# Patient Record
Sex: Female | Born: 1980 | Race: Black or African American | Hispanic: No | Marital: Single | Smoking: Former smoker
Health system: Southern US, Community
[De-identification: ages and names within clinical notes are randomized; demographics above are authoritative.]

## PROBLEM LIST (undated history)

## (undated) DIAGNOSIS — D510 Vitamin B12 deficiency anemia due to intrinsic factor deficiency: Secondary | ICD-10-CM

## (undated) DIAGNOSIS — N390 Urinary tract infection, site not specified: Secondary | ICD-10-CM

## (undated) DIAGNOSIS — D519 Vitamin B12 deficiency anemia, unspecified: Secondary | ICD-10-CM

## (undated) HISTORY — PX: HEMORROIDECTOMY: SUR656

## (undated) HISTORY — DX: Vitamin B12 deficiency anemia, unspecified: D51.9

## (undated) HISTORY — DX: Vitamin B12 deficiency anemia due to intrinsic factor deficiency: D51.0

---

## 2009-07-23 ENCOUNTER — Emergency Department (HOSPITAL_BASED_OUTPATIENT_CLINIC_OR_DEPARTMENT_OTHER): Admission: EM | Admit: 2009-07-23 | Discharge: 2009-07-23 | Payer: Self-pay | Admitting: Emergency Medicine

## 2009-09-14 ENCOUNTER — Emergency Department (HOSPITAL_BASED_OUTPATIENT_CLINIC_OR_DEPARTMENT_OTHER): Admission: EM | Admit: 2009-09-14 | Discharge: 2009-09-14 | Payer: Self-pay | Admitting: Emergency Medicine

## 2009-12-14 ENCOUNTER — Emergency Department (HOSPITAL_BASED_OUTPATIENT_CLINIC_OR_DEPARTMENT_OTHER): Admission: EM | Admit: 2009-12-14 | Discharge: 2009-12-14 | Payer: Self-pay | Admitting: Emergency Medicine

## 2010-07-06 ENCOUNTER — Emergency Department (HOSPITAL_COMMUNITY)
Admission: EM | Admit: 2010-07-06 | Discharge: 2010-07-06 | Disposition: A | Discharge status: Home / Self Care | Payer: Medicaid Other | Attending: Emergency Medicine | Admitting: Emergency Medicine

## 2010-07-06 DIAGNOSIS — O99891 Other specified diseases and conditions complicating pregnancy: Secondary | ICD-10-CM | POA: Insufficient documentation

## 2010-07-06 DIAGNOSIS — M546 Pain in thoracic spine: Secondary | ICD-10-CM | POA: Insufficient documentation

## 2010-07-06 DIAGNOSIS — M545 Low back pain, unspecified: Secondary | ICD-10-CM | POA: Insufficient documentation

## 2010-07-06 LAB — URINALYSIS, ROUTINE W REFLEX MICROSCOPIC
Ketones, ur: NEGATIVE mg/dL
Nitrite: NEGATIVE
Specific Gravity, Urine: 1.015 (ref 1.005–1.030)
pH: 7.5 (ref 5.0–8.0)

## 2010-07-06 LAB — WET PREP, GENITAL: Yeast Wet Prep HPF POC: NONE SEEN

## 2011-07-23 ENCOUNTER — Emergency Department (HOSPITAL_BASED_OUTPATIENT_CLINIC_OR_DEPARTMENT_OTHER)
Admission: EM | Admit: 2011-07-23 | Discharge: 2011-07-23 | Disposition: A | Discharge status: Home / Self Care | Payer: Self-pay | Attending: Emergency Medicine | Admitting: Emergency Medicine

## 2011-07-23 ENCOUNTER — Encounter (HOSPITAL_BASED_OUTPATIENT_CLINIC_OR_DEPARTMENT_OTHER): Payer: Self-pay | Admitting: *Deleted

## 2011-07-23 DIAGNOSIS — H9209 Otalgia, unspecified ear: Secondary | ICD-10-CM | POA: Insufficient documentation

## 2011-07-23 DIAGNOSIS — H6091 Unspecified otitis externa, right ear: Secondary | ICD-10-CM

## 2011-07-23 DIAGNOSIS — H60399 Other infective otitis externa, unspecified ear: Secondary | ICD-10-CM | POA: Insufficient documentation

## 2011-07-23 MED ORDER — AMOXICILLIN 500 MG PO CAPS: 500.0000 mg | ORAL_CAPSULE | Freq: Three times a day (TID) | ORAL | Status: AC

## 2011-07-23 MED ORDER — NEOMYCIN-COLIST-HC-THONZONIUM 3.3-3-10-0.5 MG/ML OT SUSP
4.0000 [drp] | Freq: Four times a day (QID) | OTIC | Status: DC
  Administered 2011-07-23 (×2): 4 [drp] via OTIC
  Filled 2011-07-23: qty 5

## 2011-07-23 NOTE — Discharge Instructions (Signed)
Otitis Externa Swimmer's ear (otitis externa) is an infection in the outer ear canal. It can be caused by a germ or a fungus. It may be caused by:  Swimming in dirty water.   Water that stays in the ear after swimming.  HOME CARE  Put drops in the ear canal as told by your doctor.   Only take medicine as told by your doctor.  GET HELP RIGHT AWAY IF:   You have a temperature by mouth above 102 F (38.9 C), not controlled by medicine.   There is ear pain after 3 days.  MAKE SURE YOU:   Understand these instructions.   Will watch this condition.   Will get help right away if you are not doing well or get worse.  Document Released: 10/30/2007 Document Revised: 01/23/2011 Document Reviewed: 10/30/2007 [employer] Patient Information 2012 [employer]. 

## 2011-07-23 NOTE — ED Notes (Signed)
Pt c/o right ear pain x 2 days 

## 2011-07-23 NOTE — ED Provider Notes (Signed)
History     CSN: [id number]  Arrival date & time 07/23/11  1747   First MD Initiated Contact with Patient 07/23/11 1756      Chief Complaint  Patient presents with  . Otalgia    (Consider location/radiation/quality/duration/timing/severity/associated sxs/prior treatment) Patient is a 31 y.o. female presenting with ear pain. The history is provided by the patient. No language interpreter was used.  Otalgia This is a new problem. The current episode started more than 2 days ago. There is pain in the right ear. The problem occurs constantly. The problem has not changed since onset.There has been no fever. The pain is severe. Associated symptoms include sore throat. Pertinent negatives include no rhinorrhea and no vomiting.    History reviewed. No pertinent past medical history.  History reviewed. No pertinent past surgical history.  History reviewed. No pertinent family history.  History  Substance Use Topics  . Smoking status: Current Some Day Smoker  . Smokeless tobacco: Not on file  . Alcohol Use: No    OB History    Grav Para Term Preterm Abortions TAB SAB Ect Mult Living                  Review of Systems  Constitutional: Negative.   HENT: Positive for ear pain and sore throat. Negative for rhinorrhea.   Eyes: Negative.   Respiratory: Negative.   Cardiovascular: Negative.   Gastrointestinal: Negative for vomiting.  Neurological: Negative.     Allergies  Review of patient's allergies indicates no known allergies.  Home Medications   Current Outpatient Rx  Name Route Sig Dispense Refill  . DM-PHENYLEPHRINE-ACETAMINOPHEN 10-5-325 MG PO CAPS Oral Take 2 capsules by mouth once as needed. For sinus congestion    . LEVONORGESTREL 20 MCG/24HR IU IUD Intrauterine 1 each by Intrauterine route once. Inserted in June 2012      BP 108/67  Pulse 66  Temp(Src) 98.5 F (36.9 C) (Oral)  Resp 18  Ht 5\' 5"  (1.651 m)  Wt 180 lb (81.647 kg)  BMI 29.95 kg/m2  SpO2  100%  Physical Exam  Nursing note and vitals reviewed. Constitutional: She is oriented to person, place, and time. She appears well-developed and well-nourished.  HENT:  Head: Normocephalic and atraumatic.  Left Ear: Tympanic membrane normal.  Ears:  Cardiovascular: Normal rate and regular rhythm.   Pulmonary/Chest: Effort normal and breath sounds normal.  Musculoskeletal: Normal range of motion.  Neurological: She is alert and oriented to person, place, and time.  Skin: Skin is warm and dry.    ED Course  Procedures (including critical care time)  Labs Reviewed - No data to display No results found.   1. Otitis externa of right ear       MDM  Ear wick placed in right ear as well as drops        Teressa Lower, NP 07/23/11 1831

## 2011-07-25 NOTE — ED Provider Notes (Signed)
Medical screening examination/treatment/procedure(s) were performed by non-physician practitioner and as supervising physician I was immediately available for consultation/collaboration.  Tiffanyann Deroo, MD 07/25/11 0956 

## 2011-08-19 ENCOUNTER — Emergency Department (HOSPITAL_BASED_OUTPATIENT_CLINIC_OR_DEPARTMENT_OTHER)
Admission: EM | Admit: 2011-08-19 | Discharge: 2011-08-19 | Disposition: A | Discharge status: Home / Self Care | Payer: Self-pay | Attending: Emergency Medicine | Admitting: Emergency Medicine

## 2011-08-19 ENCOUNTER — Encounter (HOSPITAL_BASED_OUTPATIENT_CLINIC_OR_DEPARTMENT_OTHER): Payer: Self-pay | Admitting: *Deleted

## 2011-08-19 DIAGNOSIS — B029 Zoster without complications: Secondary | ICD-10-CM | POA: Insufficient documentation

## 2011-08-19 DIAGNOSIS — F17200 Nicotine dependence, unspecified, uncomplicated: Secondary | ICD-10-CM | POA: Insufficient documentation

## 2011-08-19 MED ORDER — VALACYCLOVIR HCL 1 G PO TABS: 1000.0000 mg | ORAL_TABLET | Freq: Three times a day (TID) | ORAL | Status: AC

## 2011-08-19 MED ORDER — OXYCODONE-ACETAMINOPHEN 7.5-325 MG PO TABS: 1.0000 | ORAL_TABLET | ORAL | Status: AC | PRN

## 2011-08-19 NOTE — Discharge Instructions (Signed)

## 2011-08-19 NOTE — ED Provider Notes (Signed)
History  This chart was scribed for Denise Shi, MD by Bennett Scrape. This patient was seen in room MH12/MH12 and the patient's care was started at 5:49PM.  CSN: [id number]  Arrival date & time 08/19/11  1721   First MD Initiated Contact with Patient 08/19/11 1744      Chief Complaint  Patient presents with  . Rash    The history is provided by the patient. No language interpreter was used.    Denise Nunez is a 31 y.o. female who presents to the Emergency Department complaining of 3 to 4 days of gradual onset, gradually worsening, constant rash-like areas on the right flank and lower back. Pt states that the first rash-like area appeared on the right flank and then spread to the lower back. The areas are tender to the touch and that the pain is worse with touch. She reports that there was minimal reddish discharge last night. She denies taking any medications PTA to improve the symptoms. She denies fever, ear pain, congestion, cough, nausea, emesis, HA and urinary symptoms as associated symptoms. She has no h/o chronic medical conditions. She is a current everyday smoker but denies alcohol use.  History reviewed. No pertinent past medical history.  History reviewed. No pertinent past surgical history.  No family history on file.  History  Substance Use Topics  . Smoking status: Current Some Day Smoker  . Smokeless tobacco: Not on file  . Alcohol Use: No    Review of Systems  A complete 10 system review of systems was obtained and is otherwise negative except as noted in the HPI.   Allergies  Review of patient's allergies indicates no known allergies.  Home Medications   Current Outpatient Rx  Name Route Sig Dispense Refill  . DM-PHENYLEPHRINE-ACETAMINOPHEN 10-5-325 MG PO CAPS Oral Take 2 capsules by mouth once as needed. For sinus congestion    . LEVONORGESTREL 20 MCG/24HR IU IUD Intrauterine 1 each by Intrauterine route once. Inserted in June 2012    .  OXYCODONE-ACETAMINOPHEN 7.5-325 MG PO TABS Oral Take 1 tablet by mouth every 4 (four) hours as needed for pain. 30 tablet 0  . VALACYCLOVIR HCL 1 G PO TABS Oral Take 1 tablet (1,000 mg total) by mouth 3 (three) times daily. 21 tablet 0    Triage Vitals: BP 109/69  Pulse 58  Temp(Src) 98.3 F (36.8 C) (Oral)  Resp 20  SpO2 100%  Physical Exam  Nursing note and vitals reviewed. Constitutional: She is oriented to person, place, and time. She appears well-developed and well-nourished. No distress.  HENT:  Head: Normocephalic and atraumatic.  Eyes: Conjunctivae and EOM are normal. Pupils are equal, round, and reactive to light.  Neck: Normal range of motion. Neck supple.  Cardiovascular: Normal rate, regular rhythm and intact distal pulses.   Pulmonary/Chest: Effort normal and breath sounds normal. No respiratory distress.  Abdominal: Soft. Normal appearance. She exhibits no distension.  Musculoskeletal: Normal range of motion. She exhibits no edema.  Neurological: She is alert and oriented to person, place, and time. No cranial nerve deficit.  Skin: Skin is warm and dry. Rash noted.       vesicular lesions along the right flank and back area consistant with zoster    Psychiatric: She has a normal mood and affect. Her behavior is normal.    ED Course  Procedures (including critical care time)   DIAGNOSTIC STUDIES: Oxygen Saturation is 100% on room air, normal by my interpretation.    COORDINATION  OF CARE: 5:52PM-Discussed shingles diagnosis and discharge treatment plan. Will prescribe pain medications.    Labs Reviewed - No data to display No results found.   1. Shingles       MDM    I personally performed the services described in this documentation, which was scribed in my presence. The recorded information has been reviewed and considered.        Denise Shi, MD 08/20/11 1259

## 2011-08-19 NOTE — ED Notes (Signed)
Skin hurts on her right side of her abdomen and back. Pustules x 2 noted. One raised area noted.

## 2011-09-26 ENCOUNTER — Encounter (HOSPITAL_BASED_OUTPATIENT_CLINIC_OR_DEPARTMENT_OTHER): Payer: Self-pay | Admitting: Emergency Medicine

## 2011-09-26 ENCOUNTER — Emergency Department (HOSPITAL_BASED_OUTPATIENT_CLINIC_OR_DEPARTMENT_OTHER)
Admission: EM | Admit: 2011-09-26 | Discharge: 2011-09-26 | Disposition: A | Discharge status: Home / Self Care | Payer: Self-pay | Attending: Emergency Medicine | Admitting: Emergency Medicine

## 2011-09-26 DIAGNOSIS — R3589 Other polyuria: Secondary | ICD-10-CM | POA: Insufficient documentation

## 2011-09-26 DIAGNOSIS — R300 Dysuria: Secondary | ICD-10-CM | POA: Insufficient documentation

## 2011-09-26 DIAGNOSIS — Z79899 Other long term (current) drug therapy: Secondary | ICD-10-CM | POA: Insufficient documentation

## 2011-09-26 DIAGNOSIS — R358 Other polyuria: Secondary | ICD-10-CM | POA: Insufficient documentation

## 2011-09-26 DIAGNOSIS — R109 Unspecified abdominal pain: Secondary | ICD-10-CM | POA: Insufficient documentation

## 2011-09-26 DIAGNOSIS — R10819 Abdominal tenderness, unspecified site: Secondary | ICD-10-CM | POA: Insufficient documentation

## 2011-09-26 DIAGNOSIS — F17200 Nicotine dependence, unspecified, uncomplicated: Secondary | ICD-10-CM | POA: Insufficient documentation

## 2011-09-26 DIAGNOSIS — N3090 Cystitis, unspecified without hematuria: Secondary | ICD-10-CM | POA: Insufficient documentation

## 2011-09-26 LAB — URINALYSIS, ROUTINE W REFLEX MICROSCOPIC
Glucose, UA: NEGATIVE mg/dL
Ketones, ur: >80 mg/dL — AB
Protein, ur: 30 mg/dL — AB

## 2011-09-26 LAB — URINE MICROSCOPIC-ADD ON

## 2011-09-26 MED ORDER — SULFAMETHOXAZOLE-TMP DS 800-160 MG PO TABS
1.0000 | ORAL_TABLET | Freq: Once | ORAL | Status: AC
  Administered 2011-09-26: 1 via ORAL
  Filled 2011-09-26: qty 1

## 2011-09-26 MED ORDER — PHENAZOPYRIDINE HCL 200 MG PO TABS: 200.0000 mg | ORAL_TABLET | Freq: Three times a day (TID) | ORAL | Status: AC

## 2011-09-26 MED ORDER — SULFAMETHOXAZOLE-TMP DS 800-160 MG PO TABS: 1.0000 | ORAL_TABLET | Freq: Two times a day (BID) | ORAL | Status: AC

## 2011-09-26 NOTE — ED Notes (Signed)
Pt reporting 8/10 pain on discharge. No nonverbal signs of pain.

## 2011-09-26 NOTE — ED Provider Notes (Signed)
History     CSN: [id number]  Arrival date & time 09/26/11  1641   First MD Initiated Contact with Patient 09/26/11 1649      Chief Complaint  Patient presents with  . Dysuria    (Consider location/radiation/quality/duration/timing/severity/associated sxs/prior treatment) Patient is a 31 y.o. female presenting with dysuria.  Dysuria    The patient presents with dysuria.  She notes her symptoms began gradually about 2 days ago.  Since onset symptoms have been persistent, would now with polyuria and suprapubic pressure.  No clear precipitating, exacerbating, or alleviating factors.  She notes that she has no other pain, fevers, chills, nausea, vomiting, diarrhea.  She states that these complaints are similar to those she has had in the past when she has had urinary tract infections. She denies any new vaginal complaints and is adamant that she is not pregnant History reviewed. No pertinent past medical history.  History reviewed. No pertinent past surgical history.  No family history on file.  History  Substance Use Topics  . Smoking status: Current Some Day Smoker  . Smokeless tobacco: Not on file  . Alcohol Use: No    OB History    Grav Para Term Preterm Abortions TAB SAB Ect Mult Living                  Review of Systems  Genitourinary: Positive for dysuria.  All other systems reviewed and are negative.    Allergies  Review of patient's allergies indicates no known allergies.  Home Medications   Current Outpatient Rx  Name Route Sig Dispense Refill  . LEVONORGESTREL 20 MCG/24HR IU IUD Intrauterine 1 each by Intrauterine route once. Inserted in June 2012    . PHENAZOPYRIDINE HCL 200 MG PO TABS Oral Take 1 tablet (200 mg total) by mouth 3 (three) times daily. 9 tablet 0  . SULFAMETHOXAZOLE-TMP DS 800-160 MG PO TABS Oral Take 1 tablet by mouth 2 (two) times daily. 10 tablet 0    BP 121/73  Pulse 58  Temp(Src) 97.8 F (36.6 C) (Oral)  Resp 16  SpO2 100%  LMP  09/09/2011  Physical Exam  Nursing note and vitals reviewed. Constitutional: She is oriented to person, place, and time. She appears well-developed and well-nourished. No distress.  HENT:  Head: Normocephalic and atraumatic.  Eyes: Conjunctivae and EOM are normal.  Cardiovascular: Normal rate and regular rhythm.   Pulmonary/Chest: Effort normal and breath sounds normal. No stridor. No respiratory distress.  Abdominal: Soft. She exhibits no distension. There is tenderness in the suprapubic area.  Musculoskeletal: She exhibits no edema.  Neurological: She is alert and oriented to person, place, and time. No cranial nerve deficit.  Skin: Skin is warm and dry.  Psychiatric: She has a normal mood and affect.    ED Course  Procedures (including critical care time)  Labs Reviewed  URINALYSIS, ROUTINE W REFLEX MICROSCOPIC - Abnormal; Notable for the following:    APPearance CLOUDY (*)    Hgb urine dipstick MODERATE (*)    Ketones, ur >80 (*)    Protein, ur 30 (*)    Leukocytes, UA LARGE (*)    All other components within normal limits  URINE MICROSCOPIC-ADD ON - Abnormal; Notable for the following:    Squamous Epithelial / LPF FEW (*)    Bacteria, UA FEW (*)    All other components within normal limits   No results found.   1. Cystitis       MDM  This generally  well appearing female presents with several days of dysuria, polyuria, new suprapubic discomfort.  On exam she is in no distress with mild suprapubic tenderness to palpation, but no inguinal pain.  Patient's vital signs are stable.  The patient's urinalysis demonstrates abnormalities consistent with cystitis.  The patient was discharged with analgesics and antibiotics and instructed to follow up with her primary care physician.       Gerhard Munch, MD 09/26/11 [phone]

## 2011-09-26 NOTE — ED Notes (Signed)
C/o dysuria for 2 days.

## 2011-10-08 ENCOUNTER — Encounter (HOSPITAL_BASED_OUTPATIENT_CLINIC_OR_DEPARTMENT_OTHER): Payer: Self-pay | Admitting: *Deleted

## 2011-10-08 ENCOUNTER — Emergency Department (HOSPITAL_BASED_OUTPATIENT_CLINIC_OR_DEPARTMENT_OTHER)
Admission: EM | Admit: 2011-10-08 | Discharge: 2011-10-08 | Disposition: A | Discharge status: Home / Self Care | Payer: Self-pay | Attending: Emergency Medicine | Admitting: Emergency Medicine

## 2011-10-08 ENCOUNTER — Emergency Department (INDEPENDENT_AMBULATORY_CARE_PROVIDER_SITE_OTHER): Payer: Self-pay

## 2011-10-08 DIAGNOSIS — R0602 Shortness of breath: Secondary | ICD-10-CM

## 2011-10-08 DIAGNOSIS — M940 Chondrocostal junction syndrome [Tietze]: Secondary | ICD-10-CM | POA: Insufficient documentation

## 2011-10-08 DIAGNOSIS — G43909 Migraine, unspecified, not intractable, without status migrainosus: Secondary | ICD-10-CM | POA: Insufficient documentation

## 2011-10-08 DIAGNOSIS — R0789 Other chest pain: Secondary | ICD-10-CM

## 2011-10-08 DIAGNOSIS — R079 Chest pain, unspecified: Secondary | ICD-10-CM | POA: Insufficient documentation

## 2011-10-08 MED ORDER — SODIUM CHLORIDE 0.9 % IV BOLUS (SEPSIS)
1000.0000 mL | Freq: Once | INTRAVENOUS | Status: AC
  Administered 2011-10-08: 1000 mL via INTRAVENOUS

## 2011-10-08 MED ORDER — KETOROLAC TROMETHAMINE 30 MG/ML IJ SOLN
30.0000 mg | Freq: Once | INTRAMUSCULAR | Status: AC
  Administered 2011-10-08: 30 mg via INTRAVENOUS
  Filled 2011-10-08: qty 1

## 2011-10-08 MED ORDER — METOCLOPRAMIDE HCL 5 MG/ML IJ SOLN
10.0000 mg | Freq: Once | INTRAMUSCULAR | Status: AC
  Administered 2011-10-08: 10 mg via INTRAVENOUS
  Filled 2011-10-08: qty 2

## 2011-10-08 MED ORDER — DIPHENHYDRAMINE HCL 50 MG/ML IJ SOLN
25.0000 mg | Freq: Once | INTRAMUSCULAR | Status: AC
  Administered 2011-10-08: 25 mg via INTRAVENOUS
  Filled 2011-10-08: qty 1

## 2011-10-08 NOTE — Discharge Instructions (Signed)
Costochondritis Costochondritis (Tietze syndrome), or costochondral separation, is a swelling and irritation (inflammation) of the tissue (cartilage) that connects your ribs with your breastbone (sternum). It may occur on its own (spontaneously), through damage caused by an accident (trauma), or simply from coughing or minor exercise. It may take up to 6 weeks to get better and longer if you are unable to be conservative in your activities. HOME CARE INSTRUCTIONS   Avoid exhausting physical activity. Try not to strain your ribs during normal activity. This would include any activities using chest, belly (abdominal), and side muscles, especially if heavy weights are used.   Use ice for 15 to 20 minutes per hour while awake for the first 2 days. Place the ice in a plastic bag, and place a towel between the bag of ice and your skin.   Only take over-the-counter or prescription medicines for pain, discomfort, or fever as directed by your caregiver.  SEEK IMMEDIATE MEDICAL CARE IF:   Your pain increases or you are very uncomfortable.   You have a fever.   You develop difficulty with your breathing.   You cough up blood.   You develop worse chest pains, shortness of breath, sweating, or vomiting.   You develop new, unexplained problems (symptoms).  MAKE SURE YOU:   Understand these instructions.   Will watch your condition.   Will get help right away if you are not doing well or get worse.  Document Released: 02/20/2005 Document Revised: 05/02/2011 Document Reviewed: 12/30/2007 [facility] Patient Information 2012 [place], [place].  Take ibuprofen for pain

## 2011-10-08 NOTE — ED Provider Notes (Signed)
History     CSN: [id number]  Arrival date & time 10/08/11  2004   First MD Initiated Contact with Patient 10/08/11 2027      Chief Complaint  Patient presents with  . Chest Pain    (Consider location/radiation/quality/duration/timing/severity/associated sxs/prior treatment) HPI Comments: Patient also has headache consistent with prior migraines. She started smoking again today.  Patient is a 31 y.o. female presenting with chest pain. The history is provided by the patient. No language interpreter was used.  Chest Pain The chest pain began 1 - 2 hours ago. Chest pain occurs constantly. The chest pain is unchanged. The pain is associated with breathing. The severity of the pain is mild. The quality of the pain is described as pressure-like. The pain does not radiate. Chest pain is worsened by deep breathing. Primary symptoms include shortness of breath. Pertinent negatives for primary symptoms include no fever, no fatigue, no syncope, no cough, no palpitations, no abdominal pain, no nausea, no vomiting and no dizziness.  Pertinent negatives for associated symptoms include no numbness and no weakness.     History reviewed. No pertinent past medical history.  History reviewed. No pertinent past surgical history.  No family history on file.  History  Substance Use Topics  . Smoking status: Current Some Day Smoker  . Smokeless tobacco: Not on file  . Alcohol Use: No    OB History    Grav Para Term Preterm Abortions TAB SAB Ect Mult Living                  Review of Systems  Constitutional: Negative for fever, chills, activity change, appetite change and fatigue.  HENT: Negative for congestion, sore throat, rhinorrhea, neck pain and neck stiffness.   Respiratory: Positive for shortness of breath. Negative for cough.   Cardiovascular: Positive for chest pain. Negative for palpitations and syncope.  Gastrointestinal: Negative for nausea, vomiting, abdominal pain and diarrhea.    Genitourinary: Negative for dysuria, urgency, frequency and flank pain.  Musculoskeletal: Negative for myalgias, back pain and arthralgias.  Neurological: Positive for headaches. Negative for dizziness, weakness, light-headedness and numbness.  All other systems reviewed and are negative.    Allergies  Review of patient's allergies indicates no known allergies.  Home Medications   Current Outpatient Rx  Name Route Sig Dispense Refill  . LEVONORGESTREL 20 MCG/24HR IU IUD Intrauterine 1 each by Intrauterine route once. Inserted in June 2012      BP 121/74  Pulse 67  Temp(Src) 98.4 F (36.9 C) (Oral)  Resp 18  SpO2 100%  LMP 10/08/2011  Physical Exam  Nursing note and vitals reviewed. Constitutional: She is oriented to person, place, and time. She appears well-developed and well-nourished. No distress.  HENT:  Head: Normocephalic and atraumatic.  Mouth/Throat: Oropharynx is clear and moist.  Eyes: Conjunctivae and EOM are normal. Pupils are equal, round, and reactive to light.  Neck: Normal range of motion. Neck supple.  Cardiovascular: Normal rate, regular rhythm, normal heart sounds and intact distal pulses.  Exam reveals no gallop and no friction rub.   No murmur heard. Pulmonary/Chest: Effort normal and breath sounds normal. No respiratory distress. She exhibits no tenderness.  Abdominal: Soft. Bowel sounds are normal. There is no tenderness.  Musculoskeletal: Normal range of motion. She exhibits no edema and no tenderness.  Neurological: She is alert and oriented to person, place, and time. She has normal strength. No cranial nerve deficit or sensory deficit.  Skin: Skin is warm and dry.  No rash noted.    ED Course  Procedures (including critical care time)   Date: 10/08/2011  Rate: 71  Rhythm: normal sinus rhythm  QRS Axis: normal  Intervals: normal  ST/T Wave abnormalities: normal  Conduction Disutrbances:none  Narrative Interpretation:   Old EKG Reviewed:  none available  Labs Reviewed - No data to display Dg Chest 2 View  10/08/2011  *RADIOLOGY REPORT*  Clinical Data: Chest tightness, shortness of breath, and migraine  CHEST - 2 VIEW  Comparison: None.  Findings: Shallow inspiration.  Heart size and pulmonary vascularity are normal.  No focal airspace consolidation in the lungs.  No blunting of costophrenic angles.  No pneumothorax.  IMPRESSION: No evidence of active pulmonary disease.  Original Report Authenticated By: Marlon Pel, M.D.     1. Costochondritis   2. Migraine       MDM  PERC negative low clinical Gestalt for pulmonary embolus. Chest x-ray unremarkable. Symptoms resolved after receiving a headache cocktail. She received IV fluids, Toradol, Reglan, Benadryl. This also improved her chest pain. No concern about ACS or pulmonary in with the cause of her pain. She'll be discharged home with instructions to followup with her primary care physician. Ibuprofen as needed for pain. Headache is consistent with prior migraine headaches. There is no indication for imaging. I'm not concerned about a malignant cause of headache such as subarachnoid hemorrhage or meningitis.        Dayton Bailiff, MD 10/08/11 2241

## 2011-10-08 NOTE — ED Notes (Signed)
States 45 mins ago she was smoking a black and mild when she developed chest pain, a headache, and nausea. Symptoms continue on arrival to triage.

## 2011-12-14 ENCOUNTER — Emergency Department (HOSPITAL_BASED_OUTPATIENT_CLINIC_OR_DEPARTMENT_OTHER)
Admission: EM | Admit: 2011-12-14 | Discharge: 2011-12-14 | Disposition: A | Discharge status: Home / Self Care | Payer: Medicaid Other | Attending: Emergency Medicine | Admitting: Emergency Medicine

## 2011-12-14 ENCOUNTER — Encounter (HOSPITAL_BASED_OUTPATIENT_CLINIC_OR_DEPARTMENT_OTHER): Payer: Self-pay | Admitting: *Deleted

## 2011-12-14 DIAGNOSIS — N390 Urinary tract infection, site not specified: Secondary | ICD-10-CM

## 2011-12-14 DIAGNOSIS — F17200 Nicotine dependence, unspecified, uncomplicated: Secondary | ICD-10-CM | POA: Insufficient documentation

## 2011-12-14 HISTORY — DX: Urinary tract infection, site not specified: N39.0

## 2011-12-14 LAB — URINALYSIS, ROUTINE W REFLEX MICROSCOPIC
Bilirubin Urine: NEGATIVE
Nitrite: POSITIVE — AB
Specific Gravity, Urine: 1.025 (ref 1.005–1.030)
pH: 6 (ref 5.0–8.0)

## 2011-12-14 LAB — URINE MICROSCOPIC-ADD ON

## 2011-12-14 LAB — PREGNANCY, URINE: Preg Test, Ur: NEGATIVE

## 2011-12-14 MED ORDER — PHENAZOPYRIDINE HCL 200 MG PO TABS: 200.0000 mg | ORAL_TABLET | Freq: Three times a day (TID) | ORAL | Status: AC

## 2011-12-14 MED ORDER — PHENAZOPYRIDINE HCL 100 MG PO TABS
95.0000 mg | ORAL_TABLET | Freq: Once | ORAL | Status: AC
  Administered 2011-12-14: 100 mg via ORAL
  Filled 2011-12-14: qty 1

## 2011-12-14 MED ORDER — SULFAMETHOXAZOLE-TRIMETHOPRIM 800-160 MG PO TABS: 1.0000 | ORAL_TABLET | Freq: Two times a day (BID) | ORAL | Status: AC

## 2011-12-14 MED ORDER — SULFAMETHOXAZOLE-TMP DS 800-160 MG PO TABS
1.0000 | ORAL_TABLET | Freq: Once | ORAL | Status: AC
  Administered 2011-12-14: 1 via ORAL
  Filled 2011-12-14: qty 1

## 2011-12-14 NOTE — ED Provider Notes (Signed)
Medical screening examination/treatment/procedure(s) were performed by non-physician practitioner and as supervising physician I was immediately available for consultation/collaboration.   Rolan Bucco, MD 12/14/11 [phone]

## 2011-12-14 NOTE — ED Provider Notes (Signed)
History     CSN: [id number]  Arrival date & time 12/14/11  2113   First MD Initiated Contact with Patient 12/14/11 2248      Chief Complaint  Patient presents with  . Urinary Frequency    (Consider location/radiation/quality/duration/timing/severity/associated sxs/prior treatment) HPI Comments: Pt states that she is having urinary frequency and dysuria:pt denies fever:pt states that she had similar symptoms a couple of months ago:pt states that she is not having any vaginal discharge  Patient is a 31 y.o. female presenting with frequency. The history is provided by the patient. No language interpreter was used.  Urinary Frequency This is a new problem. The current episode started in the past 7 days. The problem occurs constantly. The problem has been unchanged. Pertinent negatives include no fever. Nothing aggravates the symptoms. She has tried nothing for the symptoms.    Past Medical History  Diagnosis Date  . UTI (urinary tract infection)     History reviewed. No pertinent past surgical history.  History reviewed. No pertinent family history.  History  Substance Use Topics  . Smoking status: Current Some Day Smoker  . Smokeless tobacco: Not on file  . Alcohol Use: No    OB History    Grav Para Term Preterm Abortions TAB SAB Ect Mult Living                  Review of Systems  Constitutional: Negative.  Negative for fever.  Respiratory: Negative.   Cardiovascular: Negative.   Genitourinary: Positive for frequency.    Allergies  Review of patient's allergies indicates no known allergies.  Home Medications   Current Outpatient Rx  Name Route Sig Dispense Refill  . LEVONORGESTREL 20 MCG/24HR IU IUD Intrauterine 1 each by Intrauterine route once. Inserted in June 2012    . PHENAZOPYRIDINE HCL 200 MG PO TABS Oral Take 1 tablet (200 mg total) by mouth 3 (three) times daily. 6 tablet 0  . SULFAMETHOXAZOLE-TRIMETHOPRIM 800-160 MG PO TABS Oral Take 1 tablet by  mouth every 12 (twelve) hours. 10 tablet 0    BP 120/70  Pulse 82  Temp 97.8 F (36.6 C) (Oral)  Resp 20  Ht 5\' 5"  (1.651 m)  Wt 165 lb (74.844 kg)  BMI 27.46 kg/m2  SpO2 100%  LMP 11/25/2011  Physical Exam  Nursing note and vitals reviewed. Constitutional: She is oriented to person, place, and time. She appears well-developed and well-nourished.  HENT:  Head: Normocephalic and atraumatic.  Cardiovascular: Normal rate and regular rhythm.   Pulmonary/Chest: Effort normal and breath sounds normal.  Abdominal: Soft. Bowel sounds are normal. There is no tenderness. There is no CVA tenderness.  Musculoskeletal: Normal range of motion.  Neurological: She is alert and oriented to person, place, and time.  Skin: Skin is warm and dry.  Psychiatric: She has a normal mood and affect.    ED Course  Procedures (including critical care time)  Labs Reviewed  URINALYSIS, ROUTINE W REFLEX MICROSCOPIC - Abnormal; Notable for the following:    APPearance TURBID (*)     Hgb urine dipstick LARGE (*)     Protein, ur 100 (*)     Nitrite POSITIVE (*)     Leukocytes, UA LARGE (*)     All other components within normal limits  URINE MICROSCOPIC-ADD ON - Abnormal; Notable for the following:    Squamous Epithelial / LPF FEW (*)     Bacteria, UA MANY (*)     All other components within normal  limits  PREGNANCY, URINE  URINE CULTURE   No results found.   1. UTI (lower urinary tract infection)       MDM  Will treat for simple ZOX:WRUEA sent for culture        Teressa Lower, NP 12/14/11 2322

## 2011-12-14 NOTE — ED Notes (Signed)
Frequency and dysuria x 3 days. Hx UTI's. Pt concerned because they are occurring more frequently.

## 2011-12-17 LAB — URINE CULTURE

## 2011-12-18 NOTE — ED Notes (Signed)
+   Urine Patient treated with cipro-sensitive to same-chart appended per protocol MD. 

## 2012-03-31 ENCOUNTER — Encounter (HOSPITAL_BASED_OUTPATIENT_CLINIC_OR_DEPARTMENT_OTHER): Payer: Self-pay

## 2012-03-31 ENCOUNTER — Emergency Department (HOSPITAL_BASED_OUTPATIENT_CLINIC_OR_DEPARTMENT_OTHER)
Admission: EM | Admit: 2012-03-31 | Discharge: 2012-04-01 | Disposition: A | Discharge status: Home / Self Care | Payer: Medicaid Other | Attending: Emergency Medicine | Admitting: Emergency Medicine

## 2012-03-31 DIAGNOSIS — Z87891 Personal history of nicotine dependence: Secondary | ICD-10-CM | POA: Insufficient documentation

## 2012-03-31 DIAGNOSIS — K319 Disease of stomach and duodenum, unspecified: Secondary | ICD-10-CM | POA: Insufficient documentation

## 2012-03-31 DIAGNOSIS — D649 Anemia, unspecified: Secondary | ICD-10-CM | POA: Insufficient documentation

## 2012-03-31 DIAGNOSIS — R110 Nausea: Secondary | ICD-10-CM | POA: Insufficient documentation

## 2012-03-31 DIAGNOSIS — K279 Peptic ulcer, site unspecified, unspecified as acute or chronic, without hemorrhage or perforation: Secondary | ICD-10-CM

## 2012-03-31 DIAGNOSIS — R630 Anorexia: Secondary | ICD-10-CM | POA: Insufficient documentation

## 2012-03-31 DIAGNOSIS — N390 Urinary tract infection, site not specified: Secondary | ICD-10-CM

## 2012-03-31 DIAGNOSIS — Z79899 Other long term (current) drug therapy: Secondary | ICD-10-CM | POA: Insufficient documentation

## 2012-03-31 LAB — CBC WITH DIFFERENTIAL/PLATELET
Basophils Absolute: 0 10*3/uL (ref 0.0–0.1)
Basophils Relative: 0 % (ref 0–1)
Eosinophils Absolute: 0.3 10*3/uL (ref 0.0–0.7)
Eosinophils Relative: 6 % — ABNORMAL HIGH (ref 0–5)
HCT: 26.2 % — ABNORMAL LOW (ref 36.0–46.0)
Hemoglobin: 8.8 g/dL — ABNORMAL LOW (ref 12.0–15.0)
MCH: 27.8 pg (ref 26.0–34.0)
MCHC: 33.6 g/dL (ref 30.0–36.0)
Monocytes Absolute: 0.3 10*3/uL (ref 0.1–1.0)
Monocytes Relative: 6 % (ref 3–12)
RDW: 19.1 % — ABNORMAL HIGH (ref 11.5–15.5)

## 2012-03-31 LAB — URINALYSIS, ROUTINE W REFLEX MICROSCOPIC
Ketones, ur: NEGATIVE mg/dL
Protein, ur: NEGATIVE mg/dL
Urobilinogen, UA: 1 mg/dL (ref 0.0–1.0)

## 2012-03-31 LAB — COMPREHENSIVE METABOLIC PANEL
AST: 19 U/L (ref 0–37)
Albumin: 4.4 g/dL (ref 3.5–5.2)
BUN: 15 mg/dL (ref 6–23)
Calcium: 9.6 mg/dL (ref 8.4–10.5)
Creatinine, Ser: 0.8 mg/dL (ref 0.50–1.10)
Total Bilirubin: 0.4 mg/dL (ref 0.3–1.2)
Total Protein: 7.6 g/dL (ref 6.0–8.3)

## 2012-03-31 LAB — URINE MICROSCOPIC-ADD ON

## 2012-03-31 LAB — OCCULT BLOOD X 1 CARD TO LAB, STOOL: Fecal Occult Bld: NEGATIVE

## 2012-03-31 LAB — PREGNANCY, URINE: Preg Test, Ur: NEGATIVE

## 2012-03-31 LAB — LIPASE, BLOOD: Lipase: 36 U/L (ref 11–59)

## 2012-03-31 MED ORDER — RANITIDINE HCL 150 MG PO TABS: 150.0000 mg | ORAL_TABLET | Freq: Two times a day (BID) | ORAL | Status: DC

## 2012-03-31 MED ORDER — SUCRALFATE 1 GM/10ML PO SUSP: 1.0000 g | Freq: Four times a day (QID) | ORAL | Status: DC

## 2012-03-31 MED ORDER — NITROFURANTOIN MONOHYD MACRO 100 MG PO CAPS
100.0000 mg | ORAL_CAPSULE | Freq: Once | ORAL | Status: AC
Start: 2012-03-31 — End: 2012-03-31
  Administered 2012-03-31: 100 mg via ORAL
  Filled 2012-03-31: qty 1

## 2012-03-31 MED ORDER — ONDANSETRON 4 MG PO TBDP
4.0000 mg | ORAL_TABLET | Freq: Once | ORAL | Status: AC
  Administered 2012-03-31: 4 mg via ORAL
  Filled 2012-03-31: qty 1

## 2012-03-31 MED ORDER — ONDANSETRON HCL 4 MG PO TABS: 4.0000 mg | ORAL_TABLET | Freq: Four times a day (QID) | ORAL | Status: DC

## 2012-03-31 MED ORDER — NITROFURANTOIN MONOHYD MACRO 100 MG PO CAPS: 100.0000 mg | ORAL_CAPSULE | Freq: Two times a day (BID) | ORAL | Status: DC

## 2012-03-31 MED ORDER — FAMOTIDINE 20 MG PO TABS
40.0000 mg | ORAL_TABLET | Freq: Once | ORAL | Status: AC
  Administered 2012-03-31: 40 mg via ORAL
  Filled 2012-03-31: qty 2

## 2012-03-31 MED ORDER — GI COCKTAIL ~~LOC~~
30.0000 mL | Freq: Once | ORAL | Status: AC
  Administered 2012-03-31: 30 mL via ORAL
  Filled 2012-03-31: qty 30

## 2012-03-31 NOTE — ED Notes (Signed)
Pt reports severe upper abdominal pain x 3 weeks, mid epigastric area, pain is worse after eating, pt reports some relief with lying down, but unable to tolerate sitting. Pt reports using mylanta with some relief, but pain always returned, pepto eased pain but no complete relief. Pt reports a lot of stress right now in her life

## 2012-03-31 NOTE — ED Notes (Signed)
Pt reports is unable to void at this time

## 2012-03-31 NOTE — ED Notes (Signed)
Pt recently quite smoking 2 weeks ago and has not had a cigarette since

## 2012-03-31 NOTE — ED Provider Notes (Addendum)
History     CSN: [id number]  Arrival date & time 03/31/12  2118   First MD Initiated Contact with Patient 03/31/12 2157      Chief Complaint  Patient presents with  . Abdominal Pain    (Consider location/radiation/quality/duration/timing/severity/associated sxs/prior treatment) Patient is a 31 y.o. female presenting with abdominal pain. The history is provided by the patient.  Abdominal Pain The primary symptoms of the illness include abdominal pain and nausea. The primary symptoms of the illness do not include fever, vomiting, diarrhea, vaginal discharge or vaginal bleeding. Episode onset: 3 weeks ago. The onset of the illness was gradual. The problem has been gradually worsening.  The pain came on gradually. The abdominal pain is located in the epigastric region. The abdominal pain does not radiate. The severity of the abdominal pain is 8/10. The abdominal pain is relieved by nothing. The abdominal pain is exacerbated by eating.  The illness is associated with eating (No alcohol, NSAID use). The patient states that she believes she is currently not pregnant. Additional symptoms associated with the illness include anorexia. Symptoms associated with the illness do not include constipation, urgency, frequency or back pain. Significant associated medical issues do not include PUD or GERD.    Past Medical History  Diagnosis Date  . UTI (urinary tract infection)     History reviewed. No pertinent past surgical history.  No family history on file.  History  Substance Use Topics  . Smoking status: Former Games developer  . Smokeless tobacco: Not on file  . Alcohol Use: Yes    OB History    Grav Para Term Preterm Abortions TAB SAB Ect Mult Living                  Review of Systems  Constitutional: Negative for fever.  Gastrointestinal: Positive for nausea, abdominal pain and anorexia. Negative for vomiting, diarrhea and constipation.  Genitourinary: Negative for urgency, frequency,  vaginal bleeding and vaginal discharge.  Musculoskeletal: Negative for back pain.  All other systems reviewed and are negative.    Allergies  Review of patient's allergies indicates no known allergies.  Home Medications   Current Outpatient Rx  Name  Route  Sig  Dispense  Refill  . LEVONORGESTREL 20 MCG/24HR IU IUD   Intrauterine   1 each by Intrauterine route once. Inserted in June 2012           BP 123/79  Pulse 80  Temp 98.7 F (37.1 C) (Oral)  Resp 20  Ht 5\' 5"  (1.651 m)  Wt 164 lb 4.8 oz (74.526 kg)  BMI 27.34 kg/m2  SpO2 100%  LMP 03/22/2012  Physical Exam  Nursing note and vitals reviewed. Constitutional: She is oriented to person, place, and time. She appears well-developed and well-nourished. No distress.  HENT:  Head: Normocephalic and atraumatic.  Mouth/Throat: Oropharynx is clear and moist.  Eyes: Conjunctivae normal and EOM are normal. Pupils are equal, round, and reactive to light.  Neck: Normal range of motion. Neck supple.  Cardiovascular: Normal rate, regular rhythm and intact distal pulses.   No murmur heard. Pulmonary/Chest: Effort normal and breath sounds normal. No respiratory distress. She has no wheezes. She has no rales.  Abdominal: Soft. Normal appearance. She exhibits no distension. There is tenderness in the epigastric area. There is guarding. There is no rebound, no tenderness at McBurney's point and negative Murphy's sign.  Musculoskeletal: Normal range of motion. She exhibits no edema and no tenderness.  Neurological: She is alert and oriented  to person, place, and time.  Skin: Skin is warm and dry. No rash noted. No erythema.  Psychiatric: She has a normal mood and affect. Her behavior is normal.    ED Course  Procedures (including critical care time)  Labs Reviewed  URINALYSIS, ROUTINE W REFLEX MICROSCOPIC - Abnormal; Notable for the following:    Nitrite POSITIVE (*)     Leukocytes, UA TRACE (*)     All other components within  normal limits  CBC WITH DIFFERENTIAL - Abnormal; Notable for the following:    RBC 3.17 (*)     Hemoglobin 8.8 (*)     HCT 26.2 (*)     RDW 19.1 (*)     Neutrophils Relative 32 (*)     Neutro Abs 1.6 (*)     Lymphocytes Relative 56 (*)     Eosinophils Relative 6 (*)     All other components within normal limits  URINE MICROSCOPIC-ADD ON - Abnormal; Notable for the following:    Bacteria, UA MANY (*)     All other components within normal limits  PREGNANCY, URINE  COMPREHENSIVE METABOLIC PANEL  LIPASE, BLOOD  OCCULT BLOOD X 1 CARD TO LAB, STOOL  URINE CULTURE   No results found.   1. UTI (lower urinary tract infection)   2. PUD (peptic ulcer disease)       MDM   Patient with abdominal pain is most suggestive of peptic ulcers. She complains of severe stomach pain that is worse after eating. The pain has gradually worsened over last 3 weeks. She complains of nausea after eating but denies emesis. She states she's been under an extreme amount of stress but denies NSAIDs, alcohol or tobacco use. She currently is taking no medications. On exam she has significant epigastric tenderness but no symptoms concerning for perforated viscus.  11:33 PM Labs are normal except for anemia with a hemoglobin of 8.8. Patient states in the past she's been told she was anemic and has not had any labs drawn recently. Hemoccult was done to ensure no GI bleeding and neg. Patient was placed on Zantac and Carafate. She will followup with her PCP if symptoms are not improving. She was much improved after GI cocktail. Also patient found to have a UTI today and was placed on antibiotics.      Gwyneth Sprout, MD 03/31/12 6578  Gwyneth Sprout, MD 03/31/12 [phone]

## 2012-03-31 NOTE — ED Notes (Signed)
abd pain x 3 weeks-nausea and pain worse after eating

## 2012-04-02 LAB — URINE CULTURE

## 2012-04-03 NOTE — ED Notes (Signed)
+  Urine. Patient treated with Macrobid. Sensitive to same. Per protocol MD. °

## 2012-05-05 ENCOUNTER — Encounter (HOSPITAL_BASED_OUTPATIENT_CLINIC_OR_DEPARTMENT_OTHER): Payer: Self-pay | Admitting: *Deleted

## 2012-05-05 ENCOUNTER — Emergency Department (HOSPITAL_BASED_OUTPATIENT_CLINIC_OR_DEPARTMENT_OTHER)
Admission: EM | Admit: 2012-05-05 | Discharge: 2012-05-05 | Disposition: A | Discharge status: Home / Self Care | Payer: Medicaid Other | Attending: Emergency Medicine | Admitting: Emergency Medicine

## 2012-05-05 DIAGNOSIS — Z3202 Encounter for pregnancy test, result negative: Secondary | ICD-10-CM | POA: Insufficient documentation

## 2012-05-05 DIAGNOSIS — Z87891 Personal history of nicotine dependence: Secondary | ICD-10-CM | POA: Insufficient documentation

## 2012-05-05 DIAGNOSIS — A599 Trichomoniasis, unspecified: Secondary | ICD-10-CM

## 2012-05-05 DIAGNOSIS — L293 Anogenital pruritus, unspecified: Secondary | ICD-10-CM | POA: Insufficient documentation

## 2012-05-05 DIAGNOSIS — R059 Cough, unspecified: Secondary | ICD-10-CM | POA: Insufficient documentation

## 2012-05-05 DIAGNOSIS — Z87440 Personal history of urinary (tract) infections: Secondary | ICD-10-CM | POA: Insufficient documentation

## 2012-05-05 DIAGNOSIS — R05 Cough: Secondary | ICD-10-CM | POA: Insufficient documentation

## 2012-05-05 DIAGNOSIS — J029 Acute pharyngitis, unspecified: Secondary | ICD-10-CM | POA: Insufficient documentation

## 2012-05-05 DIAGNOSIS — Z79899 Other long term (current) drug therapy: Secondary | ICD-10-CM | POA: Insufficient documentation

## 2012-05-05 LAB — URINE MICROSCOPIC-ADD ON

## 2012-05-05 LAB — URINALYSIS, ROUTINE W REFLEX MICROSCOPIC
Ketones, ur: NEGATIVE mg/dL
Nitrite: NEGATIVE
pH: 7 (ref 5.0–8.0)

## 2012-05-05 LAB — PREGNANCY, URINE: Preg Test, Ur: NEGATIVE

## 2012-05-05 LAB — WET PREP, GENITAL

## 2012-05-05 MED ORDER — CEFTRIAXONE SODIUM 250 MG IJ SOLR
250.0000 mg | Freq: Once | INTRAMUSCULAR | Status: AC
  Administered 2012-05-05: 250 mg via INTRAMUSCULAR
  Filled 2012-05-05: qty 250

## 2012-05-05 MED ORDER — AZITHROMYCIN 250 MG PO TABS
1000.0000 mg | ORAL_TABLET | Freq: Once | ORAL | Status: AC
  Administered 2012-05-05: 1000 mg via ORAL
  Filled 2012-05-05: qty 4

## 2012-05-05 MED ORDER — LIDOCAINE HCL (PF) 1 % IJ SOLN
INTRAMUSCULAR | Status: AC
  Administered 2012-05-05: 1.2 mL
  Filled 2012-05-05: qty 5

## 2012-05-05 MED ORDER — METRONIDAZOLE 500 MG PO TABS
2000.0000 mg | ORAL_TABLET | Freq: Once | ORAL | Status: AC
  Administered 2012-05-05: 2000 mg via ORAL
  Filled 2012-05-05: qty 4

## 2012-05-05 NOTE — ED Notes (Signed)
Vaginal discharge x 2 weeks. States she has been trying to treat symptoms at home without improvement. Dry cough and sore throat x 3 days.

## 2012-05-05 NOTE — ED Provider Notes (Signed)
History     CSN: [id number]  Arrival date & time 05/05/12  1701   First MD Initiated Contact with Patient 05/05/12 1717      Chief Complaint  Patient presents with  . Vaginal Discharge    (Consider location/radiation/quality/duration/timing/severity/associated sxs/prior treatment) HPI Pt reports she has had what she thought was vaginal yeast infection for the last 2 weeks, not improving with OTC treatment and in the last few days has turned into a yellow-green discharge with foul odor. She has had some vaginal itching, but no pelvic pain, nausea, vomiting, diarrhea or dysuria. She is not currently sexually active, but last had unprotected sex about a month ago. No prior history of STI, she is G2P2, does not think she is pregnant now.   She also reports 2-3 days of dry cough and sore throat, no associated CP, SOB or fever. No nasal congestion.   Past Medical History  Diagnosis Date  . UTI (urinary tract infection)     History reviewed. No pertinent past surgical history.  No family history on file.  History  Substance Use Topics  . Smoking status: Former Games developer  . Smokeless tobacco: Not on file  . Alcohol Use: Yes    OB History    Grav Para Term Preterm Abortions TAB SAB Ect Mult Living                  Review of Systems All other systems reviewed and are negative except as noted in HPI.   Allergies  Bactrim  Home Medications   Current Outpatient Rx  Name  Route  Sig  Dispense  Refill  . LEVONORGESTREL 20 MCG/24HR IU IUD   Intrauterine   1 each by Intrauterine route once. Inserted in June 2012         . NITROFURANTOIN MONOHYD MACRO 100 MG PO CAPS   Oral   Take 1 capsule (100 mg total) by mouth 2 (two) times daily.   10 capsule   0   . ONDANSETRON HCL 4 MG PO TABS   Oral   Take 1 tablet (4 mg total) by mouth every 6 (six) hours.   12 tablet   0   . RANITIDINE HCL 150 MG PO TABS   Oral   Take 1 tablet (150 mg total) by mouth 2 (two) times daily.   28 tablet   0   . SUCRALFATE 1 GM/10ML PO SUSP   Oral   Take 10 mLs (1 g total) by mouth 4 (four) times daily.   420 mL   0     LMP 04/05/2012  Physical Exam  Nursing note and vitals reviewed. Constitutional: She is oriented to person, place, and time. She appears well-developed and well-nourished.  HENT:  Head: Normocephalic and atraumatic.       Posterior pharynx is erythematous without tonsillar swelling or exudate  Eyes: EOM are normal. Pupils are equal, round, and reactive to light.  Neck: Normal range of motion. Neck supple.  Cardiovascular: Normal rate, normal heart sounds and intact distal pulses.   Pulmonary/Chest: Effort normal and breath sounds normal.  Abdominal: Bowel sounds are normal. She exhibits no distension. There is no tenderness.  Genitourinary: Uterus normal. Uterus is not enlarged. Cervix exhibits discharge. Cervix exhibits no motion tenderness. Right adnexum displays no mass and no tenderness. Left adnexum displays no mass and no tenderness. No bleeding around the vagina. No foreign body around the vagina. Vaginal discharge found.  Musculoskeletal: Normal range of motion. She exhibits  no edema and no tenderness.  Lymphadenopathy:    She has no cervical adenopathy.  Neurological: She is alert and oriented to person, place, and time. She has normal strength. No cranial nerve deficit or sensory deficit.  Skin: Skin is warm and dry. No rash noted.  Psychiatric: She has a normal mood and affect.    ED Course  Procedures (including critical care time)  Labs Reviewed  URINALYSIS, ROUTINE W REFLEX MICROSCOPIC - Abnormal; Notable for the following:    APPearance CLOUDY (*)     Leukocytes, UA LARGE (*)     All other components within normal limits  WET PREP, GENITAL - Abnormal; Notable for the following:    Trich, Wet Prep MODERATE (*)     Clue Cells Wet Prep HPF POC MODERATE (*)     WBC, Wet Prep HPF POC TOO NUMEROUS TO COUNT (*)     All other components  within normal limits  URINE MICROSCOPIC-ADD ON - Abnormal; Notable for the following:    Squamous Epithelial / LPF FEW (*)     Bacteria, UA MANY (*)     All other components within normal limits  PREGNANCY, URINE  GC/CHLAMYDIA PROBE AMP  URINE CULTURE   No results found.   No diagnosis found.    MDM  Wet Prep shows trichomonas, WBC in urine likely a vaginal contaminate, she is asymptomatic, will send for culture. Treat for trich, GC/C. Advised Health department followup and notifying sexual partners.         Morayo Leven B. Bernette Mayers, MD 05/05/12 1911

## 2012-05-06 LAB — URINE CULTURE

## 2012-05-06 LAB — GC/CHLAMYDIA PROBE AMP: GC Probe RNA: NEGATIVE

## 2013-04-27 ENCOUNTER — Encounter (HOSPITAL_BASED_OUTPATIENT_CLINIC_OR_DEPARTMENT_OTHER): Payer: Self-pay | Admitting: Emergency Medicine

## 2013-04-27 ENCOUNTER — Emergency Department (HOSPITAL_BASED_OUTPATIENT_CLINIC_OR_DEPARTMENT_OTHER)
Admission: EM | Admit: 2013-04-27 | Discharge: 2013-04-27 | Disposition: A | Discharge status: Home / Self Care | Payer: Medicaid Other | Attending: Emergency Medicine | Admitting: Emergency Medicine

## 2013-04-27 DIAGNOSIS — Z87440 Personal history of urinary (tract) infections: Secondary | ICD-10-CM | POA: Insufficient documentation

## 2013-04-27 DIAGNOSIS — H6123 Impacted cerumen, bilateral: Secondary | ICD-10-CM

## 2013-04-27 DIAGNOSIS — J069 Acute upper respiratory infection, unspecified: Secondary | ICD-10-CM | POA: Insufficient documentation

## 2013-04-27 DIAGNOSIS — Z87891 Personal history of nicotine dependence: Secondary | ICD-10-CM | POA: Insufficient documentation

## 2013-04-27 DIAGNOSIS — H6120 Impacted cerumen, unspecified ear: Secondary | ICD-10-CM | POA: Insufficient documentation

## 2013-04-27 DIAGNOSIS — Z79899 Other long term (current) drug therapy: Secondary | ICD-10-CM | POA: Insufficient documentation

## 2013-04-27 MED ORDER — DOCUSATE SODIUM 50 MG/5ML PO LIQD
1.0000 mg | Freq: Once | ORAL | Status: AC
  Administered 2013-04-27: 1 mg via OTIC
  Filled 2013-04-27: qty 10

## 2013-04-27 NOTE — ED Provider Notes (Signed)
CSN: [id number]     Arrival date & time 04/27/13  1244 History   First MD Initiated Contact with Patient 04/27/13 1401     Chief Complaint  Patient presents with  . Sore Throat   (Consider location/radiation/quality/duration/timing/severity/associated sxs/prior Treatment) Patient is a 32 y.o. female presenting with pharyngitis.  Sore Throat   Pt reports 3-4 days of mild scratchy throat, nasal congestion, and ear fullness. No cough or fever.   Past Medical History  Diagnosis Date  . UTI (urinary tract infection)    History reviewed. No pertinent past surgical history. No family history on file. History  Substance Use Topics  . Smoking status: Former Games developer  . Smokeless tobacco: Not on file  . Alcohol Use: No   OB History   Grav Para Term Preterm Abortions TAB SAB Ect Mult Living                 Review of Systems All other systems reviewed and are negative except as noted in HPI.   Allergies  Bactrim  Home Medications   Current Outpatient Rx  Name  Route  Sig  Dispense  Refill  . levonorgestrel (MIRENA) 20 MCG/24HR IUD   Intrauterine   1 each by Intrauterine route once. Inserted in June 2012         . nitrofurantoin, macrocrystal-monohydrate, (MACROBID) 100 MG capsule   Oral   Take 1 capsule (100 mg total) by mouth 2 (two) times daily.   10 capsule   0   . ondansetron (ZOFRAN) 4 MG tablet   Oral   Take 1 tablet (4 mg total) by mouth every 6 (six) hours.   12 tablet   0   . ranitidine (ZANTAC) 150 MG tablet   Oral   Take 1 tablet (150 mg total) by mouth 2 (two) times daily.   28 tablet   0   . sucralfate (CARAFATE) 1 GM/10ML suspension   Oral   Take 10 mLs (1 g total) by mouth 4 (four) times daily.   420 mL   0    BP 118/69  Pulse 68  Temp(Src) 98.6 F (37 C) (Oral)  Resp 16  Ht 5\' 5"  (1.651 m)  Wt 164 lb (74.39 kg)  BMI 27.29 kg/m2  SpO2 100% Physical Exam  Nursing note and vitals reviewed. Constitutional: She is oriented to person,  place, and time. She appears well-developed and well-nourished.  HENT:  Head: Normocephalic and atraumatic.  Nose: Mucosal edema present. No rhinorrhea.  Mouth/Throat: No oropharyngeal exudate.  Bilateral cerumen impaction  Eyes: EOM are normal. Pupils are equal, round, and reactive to light.  Neck: Normal range of motion. Neck supple.  Cardiovascular: Normal rate, normal heart sounds and intact distal pulses.   Pulmonary/Chest: Effort normal and breath sounds normal.  Abdominal: Bowel sounds are normal. She exhibits no distension. There is no tenderness.  Musculoskeletal: Normal range of motion. She exhibits no edema and no tenderness.  Neurological: She is alert and oriented to person, place, and time. She has normal strength. No cranial nerve deficit or sensory deficit.  Skin: Skin is warm and dry. No rash noted.  Psychiatric: She has a normal mood and affect.    ED Course  Procedures (including critical care time) Labs Review Labs Reviewed - No data to display Imaging Review No results found.  EKG Interpretation   None       MDM   1. Viral URI   2. Cerumen impaction, bilateral  R cerumen impaction removed by curette by myself without difficult. L cerumen impaction partially removed but required colace and irrigation by nurse. TM unremarkable under each. Other symptoms are viral in nature. Advised PCP followup.     Manford Sprong B. Bernette Mayers, MD 04/27/13 1453

## 2013-04-27 NOTE — ED Notes (Signed)
Nasal congestion with ear ache and headache since Friday

## 2013-06-13 ENCOUNTER — Emergency Department (HOSPITAL_BASED_OUTPATIENT_CLINIC_OR_DEPARTMENT_OTHER)
Admission: EM | Admit: 2013-06-13 | Discharge: 2013-06-13 | Disposition: A | Discharge status: Home / Self Care | Payer: Medicaid Other | Attending: Emergency Medicine | Admitting: Emergency Medicine

## 2013-06-13 ENCOUNTER — Encounter (HOSPITAL_BASED_OUTPATIENT_CLINIC_OR_DEPARTMENT_OTHER): Payer: Self-pay | Admitting: Emergency Medicine

## 2013-06-13 DIAGNOSIS — L03019 Cellulitis of unspecified finger: Secondary | ICD-10-CM | POA: Insufficient documentation

## 2013-06-13 DIAGNOSIS — Z87891 Personal history of nicotine dependence: Secondary | ICD-10-CM | POA: Insufficient documentation

## 2013-06-13 DIAGNOSIS — L03012 Cellulitis of left finger: Secondary | ICD-10-CM

## 2013-06-13 DIAGNOSIS — Z87440 Personal history of urinary (tract) infections: Secondary | ICD-10-CM | POA: Insufficient documentation

## 2013-06-13 DIAGNOSIS — Z79899 Other long term (current) drug therapy: Secondary | ICD-10-CM | POA: Insufficient documentation

## 2013-06-13 MED ORDER — CEPHALEXIN 500 MG PO CAPS: 500.0000 mg | ORAL_CAPSULE | Freq: Three times a day (TID) | ORAL | Status: DC

## 2013-06-13 MED ORDER — BUPIVACAINE HCL 0.25 % IJ SOLN
INTRAMUSCULAR | Status: AC
  Filled 2013-06-13: qty 1

## 2013-06-13 MED ORDER — NAPROXEN 500 MG PO TABS: 500.0000 mg | ORAL_TABLET | Freq: Two times a day (BID) | ORAL | Status: DC

## 2013-06-13 MED ORDER — HYDROCODONE-ACETAMINOPHEN 5-325 MG PO TABS: 2.0000 | ORAL_TABLET | ORAL | Status: DC | PRN

## 2013-06-13 MED ORDER — DOXYCYCLINE HYCLATE 100 MG PO CAPS: 100.0000 mg | ORAL_CAPSULE | Freq: Two times a day (BID) | ORAL | Status: DC

## 2013-06-13 NOTE — ED Provider Notes (Signed)
CSN: [id number]     Arrival date & time 06/13/13  1029 History   First MD Initiated Contact with Patient 06/13/13 1226     Chief Complaint  Patient presents with  . fingernail infection    (Consider location/radiation/quality/duration/timing/severity/associated sxs/prior Treatment) HPI Denise Nunez is a 33 y.o. female with pain, swelling and infection to the middle finger of the left hand. Symptoms started one week ago. She has been soaking the finger in warm water but it has only gotten worse. No fever, chills, nausea, vomiting or other problems.   Past Medical History  Diagnosis Date  . UTI (urinary tract infection)    No past surgical history on file. No family history on file. History  Substance Use Topics  . Smoking status: Former Research scientist (life sciences)  . Smokeless tobacco: Not on file  . Alcohol Use: No   OB History   Grav Para Term Preterm Abortions TAB SAB Ect Mult Living                 Review of Systems Negative except as stated in HPI  Allergies  Bactrim  Home Medications   Current Outpatient Rx  Name  Route  Sig  Dispense  Refill  . levonorgestrel (MIRENA) 20 MCG/24HR IUD   Intrauterine   1 each by Intrauterine route once. Inserted in June 2012         . ranitidine (ZANTAC) 150 MG tablet   Oral   Take 1 tablet (150 mg total) by mouth 2 (two) times daily.   28 tablet   0    BP 111/68  Pulse 81  Temp(Src) 98.3 F (36.8 C)  Resp 16  Ht 5\' 5"  (1.651 m)  Wt 166 lb (75.297 kg)  BMI 27.62 kg/m2  SpO2 98% Physical Exam  Nursing note and vitals reviewed. Constitutional: She is oriented to person, place, and time. She appears well-developed and well-nourished. No distress.  HENT:  Head: Normocephalic.  Eyes: Conjunctivae and EOM are normal.  Neck: Neck supple.  Cardiovascular: Normal rate.   Pulmonary/Chest: Effort normal.  Musculoskeletal: Normal range of motion.       Left hand: She exhibits tenderness and swelling. She exhibits normal range of motion, normal  capillary refill and no deformity.       Hands: Swelling, tenderness and erythema surround the nail of the middle finger of the left hand.   Neurological: She is alert and oriented to person, place, and time. No cranial nerve deficit.  Skin: Skin is warm and dry.  Psychiatric: She has a normal mood and affect. Her behavior is normal.    ED Course  Procedures  INCISION AND DRAINAGE Performed by: Jobina Maita Consent: Verbal consent obtained. Risks and benefits: risks, benefits and alternatives were discussed Type: abscess  Body area: left middle finger  Cleaned with betadine  Digital block with Sensorcaine 0.25 and lidocaine 2% without epi mixed equal  Anesthetic total: 4 ml  Incision made with #11 blade along the nail line on the radial aspect  Drainage: purulent  Drainage amount: moderate  Patient tolerance: Patient tolerated the procedure well with no immediate complications.    MDM  33 y.o. female with paronychia of the left middle finger, drained with good results. Patient stable for discharge. She will continue to soak the area will start antibiotics and pain management. She will return as needed.  Discussed with the patient and all questioned fully answered. She will return if any problems arise.    Medication List  TAKE these medications       cephALEXin 500 MG capsule  Commonly known as:  KEFLEX  Take 1 capsule (500 mg total) by mouth 3 (three) times daily.     doxycycline 100 MG capsule  Commonly known as:  VIBRAMYCIN  Take 1 capsule (100 mg total) by mouth 2 (two) times daily.     HYDROcodone-acetaminophen 5-325 MG per tablet  Commonly known as:  NORCO/VICODIN  Take 2 tablets by mouth every 4 (four) hours as needed.     naproxen 500 MG tablet  Commonly known as:  NAPROSYN  Take 1 tablet (500 mg total) by mouth 2 (two) times daily.      ASK your doctor about these medications       levonorgestrel 20 MCG/24HR IUD  Commonly known as:  MIRENA  1  each by Intrauterine route once. Inserted in June 2012     ranitidine 150 MG tablet  Commonly known as:  ZANTAC  Take 1 tablet (150 mg total) by mouth 2 (two) times daily.           [place], [place] 06/15/13 1202

## 2013-06-13 NOTE — ED Notes (Signed)
Left middle finger infection at nail bed x one week.  No known fever.

## 2013-06-13 NOTE — Discharge Instructions (Signed)
Return in 2 days for recheck. Return sooner for any problems.  Paronychia Paronychia is an inflammatory reaction involving the folds of the skin surrounding the fingernail. This is commonly caused by an infection in the skin around a nail. The most common cause of paronychia is frequent wetting of the hands (as seen with bartenders, food servers, nurses or others who wet their hands). This makes the skin around the fingernail susceptible to infection by bacteria (germs) or fungus. Other predisposing factors are:  Aggressive manicuring.  Nail biting.  Thumb sucking. The most common cause is a staphylococcal (a type of germ) infection, or a fungal (Candida) infection. When caused by a germ, it usually comes on suddenly with redness, swelling, pus and is often painful. It may get under the nail and form an abscess (collection of pus), or form an abscess around the nail. If the nail itself is infected with a fungus, the treatment is usually prolonged and may require oral medicine for up to one year. Your caregiver will determine the length of time treatment is required. The paronychia caused by bacteria (germs) may largely be avoided by not pulling on hangnails or picking at cuticles. When the infection occurs at the tips of the finger it is called felon. When the cause of paronychia is from the herpes simplex virus (HSV) it is called herpetic whitlow. TREATMENT  When an abscess is present treatment is often incision and drainage. This means that the abscess must be cut open so the pus can get out. When this is done, the following home care instructions should be followed. HOME CARE INSTRUCTIONS   It is important to keep the affected fingers very dry. Rubber or plastic gloves over cotton gloves should be used whenever the hand must be placed in water.  Keep wound clean, dry and dressed as suggested by your caregiver between warm soaks or warm compresses.  Soak in warm water for fifteen to twenty  minutes three to four times per day for bacterial infections. Fungal infections are very difficult to treat, so often require treatment for long periods of time.  For bacterial (germ) infections take antibiotics (medicine which kill germs) as directed and finish the prescription, even if the problem appears to be solved before the medicine is gone.  Only take over-the-counter or prescription medicines for pain, discomfort, or fever as directed by your caregiver. SEEK IMMEDIATE MEDICAL CARE IF:  You have redness, swelling, or increasing pain in the wound.  You notice pus coming from the wound.  You have a fever.  You notice a bad smell coming from the wound or dressing. Document Released: 11/06/2000 Document Revised: 08/05/2011 Document Reviewed: 07/08/2008 [facility] Patient Information 2014 .

## 2013-06-13 NOTE — ED Notes (Signed)
Patient here left hand middle finger nail cuticle pain, swelling, and purulent drainage x 1 week. Has soaked same with no relief. Swelling and redness noted to cuticle

## 2013-06-15 ENCOUNTER — Encounter (HOSPITAL_BASED_OUTPATIENT_CLINIC_OR_DEPARTMENT_OTHER): Payer: Self-pay | Admitting: Emergency Medicine

## 2013-06-15 ENCOUNTER — Emergency Department (HOSPITAL_BASED_OUTPATIENT_CLINIC_OR_DEPARTMENT_OTHER)
Admission: EM | Admit: 2013-06-15 | Discharge: 2013-06-15 | Disposition: A | Discharge status: Home / Self Care | Payer: Medicaid Other | Attending: Emergency Medicine | Admitting: Emergency Medicine

## 2013-06-15 DIAGNOSIS — Z792 Long term (current) use of antibiotics: Secondary | ICD-10-CM | POA: Insufficient documentation

## 2013-06-15 DIAGNOSIS — Z87891 Personal history of nicotine dependence: Secondary | ICD-10-CM | POA: Insufficient documentation

## 2013-06-15 DIAGNOSIS — L03019 Cellulitis of unspecified finger: Secondary | ICD-10-CM | POA: Insufficient documentation

## 2013-06-15 DIAGNOSIS — IMO0002 Reserved for concepts with insufficient information to code with codable children: Secondary | ICD-10-CM

## 2013-06-15 DIAGNOSIS — Z87440 Personal history of urinary (tract) infections: Secondary | ICD-10-CM | POA: Insufficient documentation

## 2013-06-15 DIAGNOSIS — Z791 Long term (current) use of non-steroidal anti-inflammatories (NSAID): Secondary | ICD-10-CM | POA: Insufficient documentation

## 2013-06-15 DIAGNOSIS — Z79899 Other long term (current) drug therapy: Secondary | ICD-10-CM | POA: Insufficient documentation

## 2013-06-15 NOTE — ED Notes (Signed)
Pt here for a recheck of left middle finger.

## 2013-06-15 NOTE — Discharge Instructions (Signed)
Paronychia   Paronychia is an infection of the skin caused by germs. It happens by the fingernail or toenail. You can avoid it by not:   Pulling on hangnails.   Nail biting.   Thumb sucking.   Cutting fingernails and toenails too short.   Cutting the skin at the base and sides of the fingernail or toenail (cuticle).  HOME CARE   Keep the fingers or toes very dry. Put rubber gloves over cotton gloves when putting hands in water.   Keep the wound clean and bandaged (dressed) as told by your doctor.   Soak the fingers or toes in warm water for 15 to 20 minutes. Soak them 3 to 4 times per day for germ infections. Fungal infections are difficult to treat. Fungal infections often require treatment for a long time.   Only take medicine as told by your doctor.  GET HELP RIGHT AWAY IF:    You have redness, puffiness (swelling), or pain that gets worse.   You see yellowish-white fluid (pus) coming from the wound.   You have a fever.   You have a bad smell coming from the wound or bandage.  MAKE SURE YOU:   Understand these instructions.   Will watch your condition.   Will get help if you are not doing well or get worse.  Document Released: 05/01/2009 Document Revised: 08/05/2011 Document Reviewed: 05/01/2009  [employer] Patient Information 2014 [employer]

## 2013-06-15 NOTE — ED Provider Notes (Signed)
CSN: [id number]     Arrival date & time 06/15/13  1112 History   First MD Initiated Contact with Patient 06/15/13 1136     Chief Complaint  Patient presents with  . Wound Check   (Consider location/radiation/quality/duration/timing/severity/associated sxs/prior Treatment) HPI Comments: Presents to the ER for scheduled repeat evaluation of finger. Patient had paronychia drained 2 days ago. She is on doxycycline. Patient reports significant improvement. Still having some pain, but no further redness, swelling and no drainage.  Patient is a 33 y.o. female presenting with wound check.  Wound Check    Past Medical History  Diagnosis Date  . UTI (urinary tract infection)    History reviewed. No pertinent past surgical history. No family history on file. History  Substance Use Topics  . Smoking status: Former Research scientist (life sciences)  . Smokeless tobacco: Not on file  . Alcohol Use: No   OB History   Grav Para Term Preterm Abortions TAB SAB Ect Mult Living                 Review of Systems  Skin: Positive for wound.    Allergies  Bactrim  Home Medications   Current Outpatient Rx  Name  Route  Sig  Dispense  Refill  . cephALEXin (KEFLEX) 500 MG capsule   Oral   Take 1 capsule (500 mg total) by mouth 3 (three) times daily.   21 capsule   0   . doxycycline (VIBRAMYCIN) 100 MG capsule   Oral   Take 1 capsule (100 mg total) by mouth 2 (two) times daily.   14 capsule   0   . HYDROcodone-acetaminophen (NORCO/VICODIN) 5-325 MG per tablet   Oral   Take 2 tablets by mouth every 4 (four) hours as needed.   10 tablet   0   . levonorgestrel (MIRENA) 20 MCG/24HR IUD   Intrauterine   1 each by Intrauterine route once. Inserted in June 2012         . naproxen (NAPROSYN) 500 MG tablet   Oral   Take 1 tablet (500 mg total) by mouth 2 (two) times daily.   20 tablet   0   . ranitidine (ZANTAC) 150 MG tablet   Oral   Take 1 tablet (150 mg total) by mouth 2 (two) times daily.   28  tablet   0    BP 122/64  Pulse 78  Temp(Src) 98.7 F (37.1 C) (Oral)  Resp 18  Ht 5\' 5"  (1.651 m)  Wt 166 lb (75.297 kg)  BMI 27.62 kg/m2  SpO2 100% Physical Exam  Musculoskeletal: She exhibits no edema.  Skin: Skin is warm and dry. No erythema.       ED Course  Procedures (including critical care time) Labs Review Labs Reviewed - No data to display Imaging Review No results found.  EKG Interpretation   None       MDM   1. Paronychia    2 recheck of paronychia, significantly improved without complaints. Continue doxycycline, no further intervention is necessary.    Orpah Greek, MD 06/15/13 1145

## 2013-06-16 NOTE — ED Provider Notes (Signed)
Medical screening examination/treatment/procedure(s) were performed by non-physician practitioner and as supervising physician I was immediately available for consultation/collaboration.  EKG Interpretation   None        Orlie Dakin, MD 06/16/13 [phone]

## 2013-08-11 ENCOUNTER — Encounter (HOSPITAL_BASED_OUTPATIENT_CLINIC_OR_DEPARTMENT_OTHER): Payer: Self-pay | Admitting: Emergency Medicine

## 2013-08-11 ENCOUNTER — Emergency Department (HOSPITAL_BASED_OUTPATIENT_CLINIC_OR_DEPARTMENT_OTHER)
Admission: EM | Admit: 2013-08-11 | Discharge: 2013-08-11 | Disposition: A | Discharge status: Home / Self Care | Payer: Medicaid Other | Attending: Emergency Medicine | Admitting: Emergency Medicine

## 2013-08-11 DIAGNOSIS — Z87440 Personal history of urinary (tract) infections: Secondary | ICD-10-CM | POA: Insufficient documentation

## 2013-08-11 DIAGNOSIS — J3489 Other specified disorders of nose and nasal sinuses: Secondary | ICD-10-CM | POA: Insufficient documentation

## 2013-08-11 DIAGNOSIS — Z791 Long term (current) use of non-steroidal anti-inflammatories (NSAID): Secondary | ICD-10-CM | POA: Insufficient documentation

## 2013-08-11 DIAGNOSIS — Z87891 Personal history of nicotine dependence: Secondary | ICD-10-CM | POA: Insufficient documentation

## 2013-08-11 DIAGNOSIS — IMO0002 Reserved for concepts with insufficient information to code with codable children: Secondary | ICD-10-CM | POA: Insufficient documentation

## 2013-08-11 DIAGNOSIS — R51 Headache: Secondary | ICD-10-CM | POA: Insufficient documentation

## 2013-08-11 DIAGNOSIS — J029 Acute pharyngitis, unspecified: Secondary | ICD-10-CM | POA: Insufficient documentation

## 2013-08-11 DIAGNOSIS — R0981 Nasal congestion: Secondary | ICD-10-CM

## 2013-08-11 DIAGNOSIS — Z792 Long term (current) use of antibiotics: Secondary | ICD-10-CM | POA: Insufficient documentation

## 2013-08-11 MED ORDER — FLUTICASONE PROPIONATE 50 MCG/ACT NA SUSP: 2.0000 | Freq: Every day | NASAL | Status: DC

## 2013-08-11 MED ORDER — SALINE SPRAY 0.65 % NA SOLN: 1.0000 | NASAL | Status: DC | PRN

## 2013-08-11 NOTE — ED Notes (Signed)
Nasal congestion, sore throat, facial pain and headache x 1 week.

## 2013-08-11 NOTE — Discharge Instructions (Signed)

## 2013-08-11 NOTE — ED Provider Notes (Signed)
CSN: [id number]     Arrival date & time 08/11/13  0086 History   First MD Initiated Contact with Patient 08/11/13 [phone]     Chief Complaint  Patient presents with  . Facial Pain  . Sore Throat  . Headache  . Nasal Congestion     (Consider location/radiation/quality/duration/timing/severity/associated sxs/prior Treatment) HPI  This is a 33 year old female who presents with nasal congestion. Patient reports a 6-7 day history of worsening sinus congestion. She denies any fevers or chills. She reports facial pain and tightness. Patient also reports mild headache at 2/10. She's taken Sudafed at home without relief.  She reports a sore throat without dysphagia. She denies any chest pain, shortness of breath.  She denies any cough, earache, nasal drainage.  Past Medical History  Diagnosis Date  . UTI (urinary tract infection)    History reviewed. No pertinent past surgical history. No family history on file. History  Substance Use Topics  . Smoking status: Former Research scientist (life sciences)  . Smokeless tobacco: Not on file  . Alcohol Use: No   OB History   Grav Para Term Preterm Abortions TAB SAB Ect Mult Living                 Review of Systems  Constitutional: Negative for fever and chills.  HENT: Positive for congestion, sinus pressure and sore throat. Negative for ear pain, facial swelling and trouble swallowing.   Respiratory: Negative for cough, chest tightness and shortness of breath.   Cardiovascular: Negative for chest pain.  Gastrointestinal: Negative for nausea, vomiting and abdominal pain.  Genitourinary: Negative for dysuria.  Skin: Negative for wound.  Neurological: Positive for headaches.  All other systems reviewed and are negative.      Allergies  Bactrim  Home Medications   Current Outpatient Rx  Name  Route  Sig  Dispense  Refill  . cephALEXin (KEFLEX) 500 MG capsule   Oral   Take 1 capsule (500 mg total) by mouth 3 (three) times daily.   21 capsule   0   .  doxycycline (VIBRAMYCIN) 100 MG capsule   Oral   Take 1 capsule (100 mg total) by mouth 2 (two) times daily.   14 capsule   0   . fluticasone (FLONASE) 50 MCG/ACT nasal spray   Each Nare   Place 2 sprays into both nostrils daily.   16 g   0   . HYDROcodone-acetaminophen (NORCO/VICODIN) 5-325 MG per tablet   Oral   Take 2 tablets by mouth every 4 (four) hours as needed.   10 tablet   0   . levonorgestrel (MIRENA) 20 MCG/24HR IUD   Intrauterine   1 each by Intrauterine route once. Inserted in June 2012         . naproxen (NAPROSYN) 500 MG tablet   Oral   Take 1 tablet (500 mg total) by mouth 2 (two) times daily.   20 tablet   0   . ranitidine (ZANTAC) 150 MG tablet   Oral   Take 1 tablet (150 mg total) by mouth 2 (two) times daily.   28 tablet   0   . sodium chloride (OCEAN) 0.65 % SOLN nasal spray   Each Nare   Place 1 spray into both nostrils as needed for congestion.   1 Bottle   0    BP 111/79  Pulse 77  Temp(Src) 98.1 F (36.7 C) (Oral)  Resp 16  Ht 5\' 5"  (1.651 m)  Wt 165 lb (74.844 kg)  BMI 27.46 kg/m2  SpO2 100%  LMP 08/08/2013 Physical Exam  Nursing note and vitals reviewed. Constitutional: She is oriented to person, place, and time. She appears well-developed and well-nourished. No distress.  HENT:  Head: Normocephalic and atraumatic.  Right Ear: External ear normal.  Left Ear: External ear normal.  Mouth/Throat: Oropharynx is clear and moist. No oropharyngeal exudate.  Mild inflammation of the bilateral nasal turbinates, tenderness to palpation over the frontal and maxillary sinuses  Eyes: Pupils are equal, round, and reactive to light.  Neck: Neck supple.  Cardiovascular: Normal rate, regular rhythm and normal heart sounds.   No murmur heard. Pulmonary/Chest: Effort normal and breath sounds normal. No respiratory distress. She has no wheezes.  Lymphadenopathy:    She has no cervical adenopathy.  Neurological: She is alert and oriented to  person, place, and time.  Skin: Skin is warm and dry.  Psychiatric: She has a normal mood and affect.    ED Course  Procedures (including critical care time) Labs Review Labs Reviewed - No data to display Imaging Review No results found.   EKG Interpretation None      MDM   Final diagnoses:  Sinus congestion    Patient presents with upper respiratory symptoms. She is nontoxic and vital signs are within normal limits. She is afebrile. She is tenderness to palpation of the bilateral maxillary sinuses and frontal sinus.  Otherwise her exam is unremarkable. Low suspicion for strep throat and she is center criteria at 1/4 for absence of cough.  Suspect viral etiology. Patient has no significant drainage. Suspect headache is related to sinus pressure. Have encouraged patient to use nasal saline for congestion and will prescribe Flonase as well. Patient will also try Mucinex.  Patient to followup with her primary care physician in the next 3-5 days if she continues to have symptoms. At this time I do not feel she has indications for antibiotics. Patient was given strict return precautions.  After history, exam, and medical workup I feel the patient has been appropriately medically screened and is safe for discharge home. Pertinent diagnoses were discussed with the patient. Patient was given return precautions.     Merryl Hacker, MD 08/11/13 [phone]

## 2014-02-05 ENCOUNTER — Emergency Department (HOSPITAL_BASED_OUTPATIENT_CLINIC_OR_DEPARTMENT_OTHER)
Admission: EM | Admit: 2014-02-05 | Discharge: 2014-02-05 | Disposition: A | Discharge status: Home / Self Care | Payer: Medicaid Other | Attending: Emergency Medicine | Admitting: Emergency Medicine

## 2014-02-05 ENCOUNTER — Encounter (HOSPITAL_BASED_OUTPATIENT_CLINIC_OR_DEPARTMENT_OTHER): Payer: Self-pay | Admitting: Emergency Medicine

## 2014-02-05 DIAGNOSIS — Z3202 Encounter for pregnancy test, result negative: Secondary | ICD-10-CM | POA: Insufficient documentation

## 2014-02-05 DIAGNOSIS — Z87891 Personal history of nicotine dependence: Secondary | ICD-10-CM | POA: Insufficient documentation

## 2014-02-05 DIAGNOSIS — N390 Urinary tract infection, site not specified: Secondary | ICD-10-CM | POA: Insufficient documentation

## 2014-02-05 DIAGNOSIS — R110 Nausea: Secondary | ICD-10-CM | POA: Diagnosis not present

## 2014-02-05 DIAGNOSIS — R1013 Epigastric pain: Secondary | ICD-10-CM

## 2014-02-05 DIAGNOSIS — Z79899 Other long term (current) drug therapy: Secondary | ICD-10-CM | POA: Insufficient documentation

## 2014-02-05 LAB — CBC WITH DIFFERENTIAL/PLATELET
Basophils Absolute: 0 10*3/uL (ref 0.0–0.1)
Basophils Relative: 0 % (ref 0–1)
Eosinophils Absolute: 0.3 10*3/uL (ref 0.0–0.7)
Eosinophils Relative: 4 % (ref 0–5)
HEMATOCRIT: 34.9 % — AB (ref 36.0–46.0)
HEMOGLOBIN: 11.5 g/dL — AB (ref 12.0–15.0)
LYMPHS ABS: 2 10*3/uL (ref 0.7–4.0)
Lymphocytes Relative: 30 % (ref 12–46)
MCH: 26 pg (ref 26.0–34.0)
MCHC: 33 g/dL (ref 30.0–36.0)
MCV: 78.8 fL (ref 78.0–100.0)
MONO ABS: 0.4 10*3/uL (ref 0.1–1.0)
MONOS PCT: 6 % (ref 3–12)
NEUTROS ABS: 4.1 10*3/uL (ref 1.7–7.7)
Neutrophils Relative %: 60 % (ref 43–77)
Platelets: 230 10*3/uL (ref 150–400)
RBC: 4.43 MIL/uL (ref 3.87–5.11)
RDW: 18.8 % — ABNORMAL HIGH (ref 11.5–15.5)
WBC: 6.8 10*3/uL (ref 4.0–10.5)

## 2014-02-05 LAB — BASIC METABOLIC PANEL
Anion gap: 12 (ref 5–15)
BUN: 14 mg/dL (ref 6–23)
CHLORIDE: 102 meq/L (ref 96–112)
CO2: 26 meq/L (ref 19–32)
CREATININE: 1 mg/dL (ref 0.50–1.10)
Calcium: 10.5 mg/dL (ref 8.4–10.5)
GFR calc non Af Amer: 74 mL/min — ABNORMAL LOW (ref >90)
GFR, EST AFRICAN AMERICAN: 86 mL/min — AB (ref >90)
GLUCOSE: 86 mg/dL (ref 70–99)
Potassium: 4.7 mEq/L (ref 3.7–5.3)
Sodium: 140 mEq/L (ref 137–147)

## 2014-02-05 LAB — URINALYSIS, ROUTINE W REFLEX MICROSCOPIC
Bilirubin Urine: NEGATIVE
GLUCOSE, UA: NEGATIVE mg/dL
Hgb urine dipstick: NEGATIVE
KETONES UR: NEGATIVE mg/dL
Nitrite: POSITIVE — AB
Protein, ur: NEGATIVE mg/dL
Specific Gravity, Urine: 1.022 (ref 1.005–1.030)
Urobilinogen, UA: 1 mg/dL (ref 0.0–1.0)
pH: 7 (ref 5.0–8.0)

## 2014-02-05 LAB — URINE MICROSCOPIC-ADD ON

## 2014-02-05 LAB — PREGNANCY, URINE: Preg Test, Ur: NEGATIVE

## 2014-02-05 LAB — LIPASE, BLOOD: Lipase: 47 U/L (ref 11–59)

## 2014-02-05 MED ORDER — LANSOPRAZOLE 15 MG PO TBDP: 15.0000 mg | ORAL_TABLET | Freq: Every day | ORAL | Status: DC

## 2014-02-05 MED ORDER — FAMOTIDINE IN NACL 20-0.9 MG/50ML-% IV SOLN
20.0000 mg | Freq: Once | INTRAVENOUS | Status: AC
  Administered 2014-02-05: 20 mg via INTRAVENOUS
  Filled 2014-02-05: qty 50

## 2014-02-05 MED ORDER — CEPHALEXIN 500 MG PO CAPS: 500.0000 mg | ORAL_CAPSULE | Freq: Four times a day (QID) | ORAL | Status: DC

## 2014-02-05 NOTE — Discharge Instructions (Signed)
Abdominal Pain °Many things can cause abdominal pain. Usually, abdominal pain is not caused by a disease and will improve without treatment. It can often be observed and treated at home. Your health care provider will do a physical exam and possibly order blood tests and X-rays to help determine the seriousness of your pain. However, in many cases, more time must pass before a clear cause of the pain can be found. Before that point, your health care provider may not know if you need more testing or further treatment. °HOME CARE INSTRUCTIONS  °Monitor your abdominal pain for any changes. The following actions may help to alleviate any discomfort you are experiencing: °· Only take over-the-counter or prescription medicines as directed by your health care provider. °· Do not take laxatives unless directed to do so by your health care provider. °· Try a clear liquid diet (broth, tea, or water) as directed by your health care provider. Slowly move to a bland diet as tolerated. °SEEK MEDICAL CARE IF: °· You have unexplained abdominal pain. °· You have abdominal pain associated with nausea or diarrhea. °· You have pain when you urinate or have a bowel movement. °· You experience abdominal pain that wakes you in the night. °· You have abdominal pain that is worsened or improved by eating food. °· You have abdominal pain that is worsened with eating fatty foods. °· You have a fever. °SEEK IMMEDIATE MEDICAL CARE IF:  °· Your pain does not go away within 2 hours. °· You keep throwing up (vomiting). °· Your pain is felt only in portions of the abdomen, such as the right side or the left lower portion of the abdomen. °· You pass bloody or black tarry stools. °MAKE SURE YOU: °· Understand these instructions.   °· Will watch your condition.   °· Will get help right away if you are not doing well or get worse.   °Document Released: 02/20/2005 Document Revised: 05/18/2013 Document Reviewed: 01/20/2013 °[employer]® Patient Information  ©2015 [employer]. This information is not intended to replace advice given to you by your health care provider. Make sure you discuss any questions you have with your health care provider. °Urinary Tract Infection °Urinary tract infections (UTIs) can develop anywhere along your urinary tract. Your urinary tract is your body's drainage system for removing wastes and extra water. Your urinary tract includes two kidneys, two ureters, a bladder, and a urethra. Your kidneys are a pair of bean-shaped organs. Each kidney is about the size of your fist. They are located below your ribs, one on each side of your spine. °CAUSES °Infections are caused by microbes, which are microscopic organisms, including fungi, viruses, and bacteria. These organisms are so small that they can only be seen through a microscope. Bacteria are the microbes that most commonly cause UTIs. °SYMPTOMS  °Symptoms of UTIs may vary by age and gender of the patient and by the location of the infection. Symptoms in young women typically include a frequent and intense urge to urinate and a painful, burning feeling in the bladder or urethra during urination. Older women and men are more likely to be tired, shaky, and weak and have muscle aches and abdominal pain. A fever may mean the infection is in your kidneys. Other symptoms of a kidney infection include pain in your back or sides below the ribs, nausea, and vomiting. °DIAGNOSIS °To diagnose a UTI, your caregiver will ask you about your symptoms. Your caregiver also will ask to provide a urine sample. The urine sample   will be tested for bacteria and white blood cells. White blood cells are made by your body to help fight infection. TREATMENT  Typically, UTIs can be treated with medication. Because most UTIs are caused by a bacterial infection, they usually can be treated with the use of antibiotics. The choice of antibiotic and length of treatment depend on your symptoms and the type of bacteria  causing your infection. HOME CARE INSTRUCTIONS  If you were prescribed antibiotics, take them exactly as your caregiver instructs you. Finish the medication even if you feel better after you have only taken some of the medication.  Drink enough water and fluids to keep your urine clear or pale yellow.  Avoid caffeine, tea, and carbonated beverages. They tend to irritate your bladder.  Empty your bladder often. Avoid holding urine for long periods of time.  Empty your bladder before and after sexual intercourse.  After a bowel movement, women should cleanse from front to back. Use each tissue only once. SEEK MEDICAL CARE IF:   You have back pain.  You develop a fever.  Your symptoms do not begin to resolve within 3 days. SEEK IMMEDIATE MEDICAL CARE IF:   You have severe back pain or lower abdominal pain.  You develop chills.  You have nausea or vomiting.  You have continued burning or discomfort with urination. MAKE SURE YOU:   Understand these instructions.  Will watch your condition.  Will get help right away if you are not doing well or get worse. Document Released: 02/20/2005 Document Revised: 11/12/2011 Document Reviewed: 06/21/2011 [facility] Patient Information 2015 [place], [place]. This information is not intended to replace advice given to you by your health care provider. Make sure you discuss any questions you have with your health care provider. Urinary Tract Infection Urinary tract infections (UTIs) can develop anywhere along your urinary tract. Your urinary tract is your body's drainage system for removing wastes and extra water. Your urinary tract includes two kidneys, two ureters, a bladder, and a urethra. Your kidneys are a pair of bean-shaped organs. Each kidney is about the size of your fist. They are located below your ribs, one on each side of your spine. CAUSES Infections are caused by microbes, which are microscopic organisms, including fungi, viruses,  and bacteria. These organisms are so small that they can only be seen through a microscope. Bacteria are the microbes that most commonly cause UTIs. SYMPTOMS  Symptoms of UTIs may vary by age and gender of the patient and by the location of the infection. Symptoms in young women typically include a frequent and intense urge to urinate and a painful, burning feeling in the bladder or urethra during urination. Older women and men are more likely to be tired, shaky, and weak and have muscle aches and abdominal pain. A fever may mean the infection is in your kidneys. Other symptoms of a kidney infection include pain in your back or sides below the ribs, nausea, and vomiting. DIAGNOSIS To diagnose a UTI, your caregiver will ask you about your symptoms. Your caregiver also will ask to provide a urine sample. The urine sample will be tested for bacteria and white blood cells. White blood cells are made by your body to help fight infection. TREATMENT  Typically, UTIs can be treated with medication. Because most UTIs are caused by a bacterial infection, they usually can be treated with the use of antibiotics. The choice of antibiotic and length of treatment depend on your symptoms and the type of bacteria  causing your infection. HOME CARE INSTRUCTIONS  If you were prescribed antibiotics, take them exactly as your caregiver instructs you. Finish the medication even if you feel better after you have only taken some of the medication.  Drink enough water and fluids to keep your urine clear or pale yellow.  Avoid caffeine, tea, and carbonated beverages. They tend to irritate your bladder.  Empty your bladder often. Avoid holding urine for long periods of time.  Empty your bladder before and after sexual intercourse.  After a bowel movement, women should cleanse from front to back. Use each tissue only once. SEEK MEDICAL CARE IF:   You have back pain.  You develop a fever.  Your symptoms do not begin to  resolve within 3 days. SEEK IMMEDIATE MEDICAL CARE IF:   You have severe back pain or lower abdominal pain.  You develop chills.  You have nausea or vomiting.  You have continued burning or discomfort with urination. MAKE SURE YOU:   Understand these instructions.  Will watch your condition.  Will get help right away if you are not doing well or get worse. Document Released: 02/20/2005 Document Revised: 11/12/2011 Document Reviewed: 06/21/2011 [facility] Patient Information 2015 [place], [place]. This information is not intended to replace advice given to you by your health care provider. Make sure you discuss any questions you have with your health care provider.

## 2014-02-05 NOTE — ED Provider Notes (Signed)
CSN: [id number]     Arrival date & time 02/05/14  1421 History   First MD Initiated Contact with Patient 02/05/14 1606     Chief Complaint  Patient presents with  . Abdominal Pain     (Consider location/radiation/quality/duration/timing/severity/associated sxs/prior Treatment) Patient is a 33 y.o. female presenting with abdominal pain. The history is provided by the patient. No language interpreter was used.  Abdominal Pain Pain location:  Epigastric Pain quality: aching and bloating   Pain radiates to:  Does not radiate Pain severity:  Moderate Onset quality:  Gradual Duration:  1 week Timing:  Constant Progression:  Worsening Chronicity:  New Context: not diet changes   Relieved by:  Nothing Worsened by:  Nothing tried Ineffective treatments:  None tried Associated symptoms: nausea   Risk factors: multiple surgeries   Risk factors: no alcohol abuse   Pt reports she was diagnosed with an ulcer about 1 year ago.  Pt reports she is now having similar pain  Past Medical History  Diagnosis Date  . UTI (urinary tract infection)    History reviewed. No pertinent past surgical history. No family history on file. History  Substance Use Topics  . Smoking status: Former Research scientist (life sciences)  . Smokeless tobacco: Not on file  . Alcohol Use: No   OB History   Grav Para Term Preterm Abortions TAB SAB Ect Mult Living                 Review of Systems  Gastrointestinal: Positive for nausea and abdominal pain.  All other systems reviewed and are negative.     Allergies  Bactrim  Home Medications   Prior to Admission medications   Medication Sig Start Date End Date Taking? Authorizing Provider  cephALEXin (KEFLEX) 500 MG capsule Take 1 capsule (500 mg total) by mouth 3 (three) times daily. 06/13/13   Hope Bunnie Pion, NP  doxycycline (VIBRAMYCIN) 100 MG capsule Take 1 capsule (100 mg total) by mouth 2 (two) times daily. 06/13/13   Hope Bunnie Pion, NP  fluticasone (FLONASE) 50 MCG/ACT nasal  spray Place 2 sprays into both nostrils daily. 08/11/13   Merryl Hacker, MD  HYDROcodone-acetaminophen (NORCO/VICODIN) 5-325 MG per tablet Take 2 tablets by mouth every 4 (four) hours as needed. 06/13/13   [place], NP  levonorgestrel (MIRENA) 20 MCG/24HR IUD 1 each by Intrauterine route once. Inserted in June 2012    Historical Provider, MD  naproxen (NAPROSYN) 500 MG tablet Take 1 tablet (500 mg total) by mouth 2 (two) times daily. 06/13/13   Hope Bunnie Pion, NP  ranitidine (ZANTAC) 150 MG tablet Take 1 tablet (150 mg total) by mouth 2 (two) times daily. 03/31/12   Blanchie Dessert, MD  sodium chloride (OCEAN) 0.65 % SOLN nasal spray Place 1 spray into both nostrils as needed for congestion. 08/11/13   Merryl Hacker, MD   BP 113/73  Pulse 75  Temp(Src) 98.2 F (36.8 C) (Oral)  Resp 16  Ht 5\' 5"  (1.651 m)  Wt 166 lb 4.8 oz (75.433 kg)  BMI 27.67 kg/m2  SpO2 100%  LMP 01/25/2014 Physical Exam  Nursing note and vitals reviewed. Constitutional: She is oriented to person, place, and time. She appears well-developed and well-nourished.  HENT:  Head: Normocephalic and atraumatic.  Right Ear: External ear normal.  Left Ear: External ear normal.  Nose: Nose normal.  Eyes: Conjunctivae and EOM are normal. Pupils are equal, round, and reactive to light.  Neck: Normal range of motion.  Cardiovascular: Normal rate and normal heart sounds.   Pulmonary/Chest: Effort normal.  Abdominal: Soft. She exhibits no distension.  Musculoskeletal: Normal range of motion.  Neurological: She is alert and oriented to person, place, and time.  Skin: Skin is warm.  Psychiatric: She has a normal mood and affect.    ED Course  Procedures (including critical care time) Labs Review Labs Reviewed  CBC WITH DIFFERENTIAL - Abnormal; Notable for the following:    Hemoglobin 11.5 (*)    HCT 34.9 (*)    RDW 18.8 (*)    All other components within normal limits  PREGNANCY, URINE  URINALYSIS, ROUTINE W  REFLEX MICROSCOPIC  BASIC METABOLIC PANEL  LIPASE, BLOOD    Imaging Review No results found.   EKG Interpretation None      Results for orders placed during the hospital encounter of 02/05/14  PREGNANCY, URINE      Result Value Ref Range   Preg Test, Ur NEGATIVE  NEGATIVE  URINALYSIS, ROUTINE W REFLEX MICROSCOPIC      Result Value Ref Range   Color, Urine YELLOW  YELLOW   APPearance CLOUDY (*) CLEAR   Specific Gravity, Urine 1.022  1.005 - 1.030   pH 7.0  5.0 - 8.0   Glucose, UA NEGATIVE  NEGATIVE mg/dL   Hgb urine dipstick NEGATIVE  NEGATIVE   Bilirubin Urine NEGATIVE  NEGATIVE   Ketones, ur NEGATIVE  NEGATIVE mg/dL   Protein, ur NEGATIVE  NEGATIVE mg/dL   Urobilinogen, UA 1.0  0.0 - 1.0 mg/dL   Nitrite POSITIVE (*) NEGATIVE   Leukocytes, UA SMALL (*) NEGATIVE  CBC WITH DIFFERENTIAL      Result Value Ref Range   WBC 6.8  4.0 - 10.5 K/uL   RBC 4.43  3.87 - 5.11 MIL/uL   Hemoglobin 11.5 (*) 12.0 - 15.0 g/dL   HCT 34.9 (*) 36.0 - 46.0 %   MCV 78.8  78.0 - 100.0 fL   MCH 26.0  26.0 - 34.0 pg   MCHC 33.0  30.0 - 36.0 g/dL   RDW 18.8 (*) 11.5 - 15.5 %   Platelets 230  150 - 400 K/uL   Neutrophils Relative % 60  43 - 77 %   Neutro Abs 4.1  1.7 - 7.7 K/uL   Lymphocytes Relative 30  12 - 46 %   Lymphs Abs 2.0  0.7 - 4.0 K/uL   Monocytes Relative 6  3 - 12 %   Monocytes Absolute 0.4  0.1 - 1.0 K/uL   Eosinophils Relative 4  0 - 5 %   Eosinophils Absolute 0.3  0.0 - 0.7 K/uL   Basophils Relative 0  0 - 1 %   Basophils Absolute 0.0  0.0 - 0.1 K/uL  BASIC METABOLIC PANEL      Result Value Ref Range   Sodium 140  137 - 147 mEq/L   Potassium 4.7  3.7 - 5.3 mEq/L   Chloride 102  96 - 112 mEq/L   CO2 26  19 - 32 mEq/L   Glucose, Bld 86  70 - 99 mg/dL   BUN 14  6 - 23 mg/dL   Creatinine, Ser 1.00  0.50 - 1.10 mg/dL   Calcium 10.5  8.4 - 10.5 mg/dL   GFR calc non Af Amer 74 (*) >90 mL/min   GFR calc Af Amer 86 (*) >90 mL/min   Anion gap 12  5 - 15  LIPASE, BLOOD       Result Value Ref  Range   Lipase 47  11 - 59 U/L  URINE MICROSCOPIC-ADD ON      Result Value Ref Range   Squamous Epithelial / LPF RARE  RARE   WBC, UA 11-20  <3 WBC/hpf   Bacteria, UA MANY (*) RARE   Urine-Other MUCOUS PRESENT     No results found.   MDM Pt given iv fluids and iv pepcid.  Pt reports feeling better,  Labs show uti.   I will treat with keflex and prevcid   Final diagnoses:  Epigastric pain  UTI (lower urinary tract infection)        Fransico Meadow, PA-C 02/05/14 1844

## 2014-02-05 NOTE — ED Notes (Signed)
Pt reports upper abdominal pain x 1 week. States hx of ulcers. States now has no appetite.

## 2014-02-11 NOTE — ED Provider Notes (Signed)
Medical screening examination/treatment/procedure(s) were performed by non-physician practitioner and as supervising physician I was immediately available for consultation/collaboration.   EKG Interpretation None        Tanna Furry, MD 02/11/14 0004

## 2015-09-22 ENCOUNTER — Encounter (HOSPITAL_BASED_OUTPATIENT_CLINIC_OR_DEPARTMENT_OTHER): Payer: Self-pay | Admitting: *Deleted

## 2015-09-22 ENCOUNTER — Inpatient Hospital Stay (HOSPITAL_BASED_OUTPATIENT_CLINIC_OR_DEPARTMENT_OTHER)
Admission: EM | Admit: 2015-09-22 | Discharge: 2015-09-26 | DRG: 811 | Disposition: A | Discharge status: Home / Self Care | Payer: BLUE CROSS/BLUE SHIELD | Attending: Internal Medicine | Admitting: Internal Medicine

## 2015-09-22 DIAGNOSIS — K30 Functional dyspepsia: Secondary | ICD-10-CM | POA: Diagnosis present

## 2015-09-22 DIAGNOSIS — R519 Headache, unspecified: Secondary | ICD-10-CM | POA: Diagnosis present

## 2015-09-22 DIAGNOSIS — Z87440 Personal history of urinary (tract) infections: Secondary | ICD-10-CM

## 2015-09-22 DIAGNOSIS — R195 Other fecal abnormalities: Secondary | ICD-10-CM | POA: Diagnosis present

## 2015-09-22 DIAGNOSIS — R5383 Other fatigue: Secondary | ICD-10-CM | POA: Insufficient documentation

## 2015-09-22 DIAGNOSIS — D649 Anemia, unspecified: Secondary | ICD-10-CM | POA: Diagnosis present

## 2015-09-22 DIAGNOSIS — D219 Benign neoplasm of connective and other soft tissue, unspecified: Secondary | ICD-10-CM

## 2015-09-22 DIAGNOSIS — R6881 Early satiety: Secondary | ICD-10-CM | POA: Diagnosis present

## 2015-09-22 DIAGNOSIS — E538 Deficiency of other specified B group vitamins: Secondary | ICD-10-CM | POA: Diagnosis present

## 2015-09-22 DIAGNOSIS — R110 Nausea: Secondary | ICD-10-CM | POA: Diagnosis present

## 2015-09-22 DIAGNOSIS — K5900 Constipation, unspecified: Secondary | ICD-10-CM | POA: Diagnosis present

## 2015-09-22 DIAGNOSIS — R51 Headache: Secondary | ICD-10-CM

## 2015-09-22 DIAGNOSIS — D500 Iron deficiency anemia secondary to blood loss (chronic): Principal | ICD-10-CM | POA: Diagnosis present

## 2015-09-22 DIAGNOSIS — Z882 Allergy status to sulfonamides status: Secondary | ICD-10-CM

## 2015-09-22 DIAGNOSIS — R0602 Shortness of breath: Secondary | ICD-10-CM | POA: Insufficient documentation

## 2015-09-22 DIAGNOSIS — E43 Unspecified severe protein-calorie malnutrition: Secondary | ICD-10-CM | POA: Diagnosis present

## 2015-09-22 DIAGNOSIS — Z87891 Personal history of nicotine dependence: Secondary | ICD-10-CM

## 2015-09-22 DIAGNOSIS — R42 Dizziness and giddiness: Secondary | ICD-10-CM | POA: Diagnosis not present

## 2015-09-22 LAB — PREGNANCY, URINE: Preg Test, Ur: NEGATIVE

## 2015-09-22 LAB — URINALYSIS, ROUTINE W REFLEX MICROSCOPIC
Bilirubin Urine: NEGATIVE
Glucose, UA: NEGATIVE mg/dL
Ketones, ur: NEGATIVE mg/dL
Leukocytes, UA: NEGATIVE
Nitrite: NEGATIVE
PROTEIN: NEGATIVE mg/dL
Specific Gravity, Urine: 1.007 (ref 1.005–1.030)
pH: 6 (ref 5.0–8.0)

## 2015-09-22 LAB — URINE MICROSCOPIC-ADD ON

## 2015-09-22 NOTE — ED Provider Notes (Signed)
CSN: [id number]     Arrival date & time 09/22/15  2043 History   By signing my name below, I, Forrestine Him, attest that this documentation has been prepared under the direction and in the presence of Josh Annali Lybrand PA-C.  Electronically Signed: Forrestine Him, ED Scribe. 09/22/2015. 10:50 PM.   Chief Complaint  Patient presents with  . Dizziness   The history is provided by the patient. No language interpreter was used.    HPI Comments: Denise Nunez is a 35 y.o. female without any pertinent past medical history who presents to the Emergency Department complaining of intermittent, ongoing dizziness x 2 weeks. Dizziness is described as both tunnel vision and spinning. Episodes have been more consistent since time of onset. She also reports intermittent, decreased appetite, lightheadedness, and fatigue. Lightheadedness is exacerbated with movement. No alleviating factors at this time. No OTC medications or home remedies attempted prior to arrival. No recent fever, chills, nausea, vomiting, chest pain, shortness of breath, dysuria, or tinnitus. No facial droop, slurred speech, numbness, or weakness on one side of body. She is currently Menstruating. Patient states that her typical periods last 5-7 days and are regular. She denies excessive bleeding. Patient denies heavy NSAID or alcohol use.  PCP: Glory Buff, MD    Past Medical History  Diagnosis Date  . UTI (urinary tract infection)    History reviewed. No pertinent past surgical history. History reviewed. No pertinent family history. Social History  Substance Use Topics  . Smoking status: Former Research scientist (life sciences)  . Smokeless tobacco: None  . Alcohol Use: No   OB History    No data available     Review of Systems  Constitutional: Positive for chills, appetite change and fatigue. Negative for fever.  HENT: Negative for ear pain, rhinorrhea, sore throat and tinnitus.   Eyes: Negative for redness.  Respiratory: Negative for cough and shortness of  breath.   Cardiovascular: Negative for chest pain.  Gastrointestinal: Negative for nausea, vomiting, abdominal pain and diarrhea.  Genitourinary: Negative for dysuria.  Musculoskeletal: Negative for myalgias.  Skin: Negative for rash.  Neurological: Positive for dizziness and light-headedness. Negative for facial asymmetry, weakness, numbness and headaches.  Psychiatric/Behavioral: Negative for confusion.      Allergies  Bactrim  Home Medications   Prior to Admission medications   Not on File   Triage Vitals: BP 117/53 mmHg  Pulse 86  Temp(Src) 98.9 F (37.2 C) (Oral)  Resp 18  Ht 5\' 5"  (1.651 m)  Wt 150 lb (68.04 kg)  BMI 24.96 kg/m2  SpO2 100%  LMP 09/22/2015   Physical Exam  Constitutional: She is oriented to person, place, and time. She appears well-developed and well-nourished.  HENT:  Head: Normocephalic and atraumatic.  Right Ear: External ear normal.  Left Ear: External ear normal.  Cerumen impaction bilaterally.  Eyes: EOM are normal. Pupils are equal, round, and reactive to light. Right eye exhibits no discharge. Left eye exhibits no discharge.  No nystagmus. Conjunctiva pale.  Neck: Normal range of motion. Neck supple.  Cardiovascular: Normal rate, regular rhythm and normal heart sounds.   No murmur heard. Pulmonary/Chest: Effort normal and breath sounds normal. No respiratory distress. She has no wheezes. She has no rales.  Abdominal: Soft. She exhibits no distension. There is no tenderness.  Musculoskeletal: Normal range of motion.  Neurological: She is alert and oriented to person, place, and time.  Skin: Skin is warm and dry.  Psychiatric: She has a normal mood and affect.  Nursing note  and vitals reviewed.   ED Course  Procedures (including critical care time)  DIAGNOSTIC STUDIES: Oxygen Saturation is 100% on RA, Normal by my interpretation.    COORDINATION OF CARE: 10:44 PM- Will order pregnancy urine, EKG, and urinalysis. Discussed  treatment plan with pt at bedside and pt agreed to plan.     Labs Review Labs Reviewed  URINALYSIS, ROUTINE W REFLEX MICROSCOPIC (NOT AT [facility]) - Abnormal; Notable for the following:    Hgb urine dipstick MODERATE (*)    All other components within normal limits  CBC WITH DIFFERENTIAL/PLATELET - Abnormal; Notable for the following:    RBC 1.83 (*)    Hemoglobin 5.1 (*)    HCT 15.7 (*)    RDW 24.4 (*)    Neutro Abs 1.6 (*)    All other components within normal limits  BASIC METABOLIC PANEL - Abnormal; Notable for the following:    Anion gap 4 (*)    All other components within normal limits  URINE MICROSCOPIC-ADD ON - Abnormal; Notable for the following:    Squamous Epithelial / LPF 0-5 (*)    Bacteria, UA RARE (*)    All other components within normal limits  PREGNANCY, URINE  POC OCCULT BLOOD, ED     ED ECG REPORT   Date: 09/22/2015  Rate: 72  Rhythm: normal sinus rhythm  QRS Axis: normal  Intervals: normal  ST/T Wave abnormalities: Nonspecific T wave changes  Conduction Disutrbances:none  Narrative Interpretation:   Old EKG Reviewed: unchanged Similar to 09/2012  I have personally reviewed the EKG tracing and agree with the computerized printout as noted.    Vital signs reviewed and are as follows: BP 116/72 mmHg  Pulse 72  Temp(Src) 98.9 F (37.2 C) (Oral)  Resp 18  Ht 5\' 5"  (1.651 m)  Wt 68.04 kg  BMI 24.96 kg/m2  SpO2 100%  LMP 09/22/2015  Hemoglobin 5.1. Pt informed -- will need admission for work-up and blood transfusion.   Patient discussed with Dr. Florina Ou.   Spoke with Dr. Fuller Plan of [facility] who will admit patient. Tele observation bed requested. Hemoccult pending.    MDM   Final diagnoses:  Anemia, unspecified anemia type  Shortness of breath  Other fatigue   Patient with symptomatic anemia of unclear etiology. Admit.   I personally performed the services described in this documentation, which was scribed in my presence.  The recorded information has been reviewed and is accurate.    Carlisle Cater, PA-C 09/23/15 0115  Shanon Rosser, MD 09/23/15 [id number]

## 2015-09-22 NOTE — ED Notes (Signed)
Pt reports dizziness, nausea, increased dizziness with quick movement x 2 weeks.  Ambulatory.  Reports increased fatigue.

## 2015-09-23 ENCOUNTER — Encounter (HOSPITAL_COMMUNITY): Payer: Self-pay | Admitting: Family Medicine

## 2015-09-23 ENCOUNTER — Inpatient Hospital Stay (HOSPITAL_COMMUNITY): Payer: BLUE CROSS/BLUE SHIELD

## 2015-09-23 DIAGNOSIS — D649 Anemia, unspecified: Secondary | ICD-10-CM | POA: Diagnosis not present

## 2015-09-23 DIAGNOSIS — R110 Nausea: Secondary | ICD-10-CM

## 2015-09-23 DIAGNOSIS — R51 Headache: Secondary | ICD-10-CM

## 2015-09-23 DIAGNOSIS — R6881 Early satiety: Secondary | ICD-10-CM | POA: Diagnosis not present

## 2015-09-23 DIAGNOSIS — G4489 Other headache syndrome: Secondary | ICD-10-CM

## 2015-09-23 DIAGNOSIS — R42 Dizziness and giddiness: Secondary | ICD-10-CM | POA: Diagnosis present

## 2015-09-23 DIAGNOSIS — R5383 Other fatigue: Secondary | ICD-10-CM | POA: Insufficient documentation

## 2015-09-23 DIAGNOSIS — Z87440 Personal history of urinary (tract) infections: Secondary | ICD-10-CM | POA: Diagnosis not present

## 2015-09-23 DIAGNOSIS — Z882 Allergy status to sulfonamides status: Secondary | ICD-10-CM | POA: Diagnosis not present

## 2015-09-23 DIAGNOSIS — R0602 Shortness of breath: Secondary | ICD-10-CM | POA: Insufficient documentation

## 2015-09-23 DIAGNOSIS — R1013 Epigastric pain: Secondary | ICD-10-CM | POA: Diagnosis not present

## 2015-09-23 DIAGNOSIS — K5900 Constipation, unspecified: Secondary | ICD-10-CM | POA: Diagnosis present

## 2015-09-23 DIAGNOSIS — K30 Functional dyspepsia: Secondary | ICD-10-CM | POA: Diagnosis present

## 2015-09-23 DIAGNOSIS — E43 Unspecified severe protein-calorie malnutrition: Secondary | ICD-10-CM | POA: Diagnosis present

## 2015-09-23 DIAGNOSIS — E538 Deficiency of other specified B group vitamins: Secondary | ICD-10-CM | POA: Diagnosis present

## 2015-09-23 DIAGNOSIS — R195 Other fecal abnormalities: Secondary | ICD-10-CM | POA: Diagnosis present

## 2015-09-23 DIAGNOSIS — D500 Iron deficiency anemia secondary to blood loss (chronic): Secondary | ICD-10-CM | POA: Diagnosis present

## 2015-09-23 DIAGNOSIS — Z87891 Personal history of nicotine dependence: Secondary | ICD-10-CM | POA: Diagnosis not present

## 2015-09-23 DIAGNOSIS — R519 Headache, unspecified: Secondary | ICD-10-CM | POA: Diagnosis present

## 2015-09-23 LAB — ABO/RH: ABO/RH(D): B POS

## 2015-09-23 LAB — BASIC METABOLIC PANEL WITH GFR
Anion gap: 4 — ABNORMAL LOW (ref 5–15)
BUN: 12 mg/dL (ref 6–20)
CO2: 24 mmol/L (ref 22–32)
Calcium: 9 mg/dL (ref 8.9–10.3)
Chloride: 107 mmol/L (ref 101–111)
Creatinine, Ser: 0.84 mg/dL (ref 0.44–1.00)
GFR calc Af Amer: >60 mL/min
GFR calc non Af Amer: >60 mL/min
Glucose, Bld: 88 mg/dL (ref 65–99)
Potassium: 3.5 mmol/L (ref 3.5–5.1)
Sodium: 135 mmol/L (ref 135–145)

## 2015-09-23 LAB — GLUCOSE, CAPILLARY: GLUCOSE-CAPILLARY: 88 mg/dL (ref 65–99)

## 2015-09-23 LAB — CBC WITH DIFFERENTIAL/PLATELET
Basophils Absolute: 0 K/uL (ref 0.0–0.1)
Basophils Relative: 0 %
Eosinophils Absolute: 0.1 K/uL (ref 0.0–0.7)
Eosinophils Relative: 3 %
HCT: 15.7 % — ABNORMAL LOW (ref 36.0–46.0)
Hemoglobin: 5.1 g/dL — CL (ref 12.0–15.0)
Lymphocytes Relative: 59 %
Lymphs Abs: 2.7 K/uL (ref 0.7–4.0)
MCH: 27.9 pg (ref 26.0–34.0)
MCHC: 32.5 g/dL (ref 30.0–36.0)
MCV: 85.8 fL (ref 78.0–100.0)
Monocytes Absolute: 0.2 K/uL (ref 0.1–1.0)
Monocytes Relative: 4 %
Neutro Abs: 1.6 K/uL — ABNORMAL LOW (ref 1.7–7.7)
Neutrophils Relative %: 34 %
Platelets: 261 K/uL (ref 150–400)
RBC: 1.83 MIL/uL — ABNORMAL LOW (ref 3.87–5.11)
RDW: 24.4 % — ABNORMAL HIGH (ref 11.5–15.5)
WBC: 4.6 K/uL (ref 4.0–10.5)

## 2015-09-23 LAB — OCCULT BLOOD X 1 CARD TO LAB, STOOL: Fecal Occult Bld: POSITIVE — AB

## 2015-09-23 LAB — HEMOGLOBIN AND HEMATOCRIT, BLOOD
HEMATOCRIT: 23.8 % — AB (ref 36.0–46.0)
Hemoglobin: 7.9 g/dL — ABNORMAL LOW (ref 12.0–15.0)

## 2015-09-23 LAB — PREPARE RBC (CROSSMATCH)

## 2015-09-23 MED ORDER — SODIUM CHLORIDE 0.9% FLUSH
3.0000 mL | Freq: Two times a day (BID) | INTRAVENOUS | Status: DC
  Administered 2015-09-23 – 2015-09-26 (×6): 3 mL via INTRAVENOUS

## 2015-09-23 MED ORDER — BOOST / RESOURCE BREEZE PO LIQD
1.0000 | Freq: Three times a day (TID) | ORAL | Status: DC
  Administered 2015-09-23 – 2015-09-26 (×5): 1 via ORAL

## 2015-09-23 MED ORDER — PANTOPRAZOLE SODIUM 40 MG IV SOLR
80.0000 mg | Freq: Once | INTRAVENOUS | Status: AC
Start: 2015-09-23 — End: 2015-09-23
  Administered 2015-09-23: 80 mg via INTRAVENOUS
  Filled 2015-09-23: qty 80

## 2015-09-23 MED ORDER — ACETAMINOPHEN 650 MG RE SUPP
650.0000 mg | Freq: Four times a day (QID) | RECTAL | Status: DC | PRN
Start: 2015-09-23 — End: 2015-09-26

## 2015-09-23 MED ORDER — SODIUM CHLORIDE 0.9% FLUSH: 3.0000 mL | INTRAVENOUS | Status: DC | PRN

## 2015-09-23 MED ORDER — ACETAMINOPHEN 325 MG PO TABS
650.0000 mg | ORAL_TABLET | Freq: Four times a day (QID) | ORAL | Status: DC | PRN
Start: 2015-09-23 — End: 2015-09-26
  Administered 2015-09-23 – 2015-09-24 (×2): 650 mg via ORAL
  Filled 2015-09-23 (×4): qty 2

## 2015-09-23 MED ORDER — SODIUM CHLORIDE 0.9 % IV SOLN
Freq: Once | INTRAVENOUS | Status: AC
  Administered 2015-09-23: 16:00:00 via INTRAVENOUS

## 2015-09-23 MED ORDER — PANTOPRAZOLE SODIUM 40 MG IV SOLR
40.0000 mg | Freq: Two times a day (BID) | INTRAVENOUS | Status: DC
  Administered 2015-09-23 – 2015-09-26 (×6): 40 mg via INTRAVENOUS
  Filled 2015-09-23 (×6): qty 40

## 2015-09-23 MED ORDER — SODIUM CHLORIDE 0.9 % IV SOLN: 250.0000 mL | INTRAVENOUS | Status: DC | PRN

## 2015-09-23 MED ORDER — HYDROCODONE-ACETAMINOPHEN 5-325 MG PO TABS
1.0000 | ORAL_TABLET | ORAL | Status: DC | PRN
  Administered 2015-09-23 – 2015-09-24 (×4): 2 via ORAL
  Administered 2015-09-25: 1 via ORAL
  Filled 2015-09-23 (×2): qty 2
  Filled 2015-09-23: qty 1
  Filled 2015-09-23 (×2): qty 2

## 2015-09-23 MED ORDER — SODIUM CHLORIDE 0.9% FLUSH: 3.0000 mL | Freq: Two times a day (BID) | INTRAVENOUS | Status: DC

## 2015-09-23 NOTE — Progress Notes (Signed)
[facility] Brief Accept note.   I have reviewed Dr. Criss Rosales note.  Patient with acute symptomatic anemia, sources likely menstrual vs. GI.  She has a history of hemorrhoids which could explain +FOBT in the ED.  She has noticed no blood loss, no blood in the stool, + dark stools but thought to be due to iron.  She does not take NSAIDs.  She has a family history of anemia and family members with heavy menstrual bleeding.  However, she does not feel that she has particularly heavy menstrual bleeding.  She reports no family history of sickle cell disease or thalassemia.   Unfortunately, a type and screen was inappropriately cancelled at about 6am this morning and therefore her blood products have been delayed.  She has a type and screen which is pending now and nursing will obtain blood products when possible.    Will consult GI for possible work up for PUD.   Will order transvaginal ultrasound to evaluate for fibroids.   Gilles Chiquito, MD

## 2015-09-23 NOTE — Progress Notes (Signed)
New Admission Note:   Arrival Method:  Via stretcher from [facility] Mental Orientation:  A & O x 4 Telemetry:   Placed on Telemetry Box [phone] Assessment: Completed Skin:  Tattoos but otherwise intact IV:  Left Medial Forearm NSL Pain:  C/O headache 9/10 Tubes:  None Admission: Completed [address] [place] Orientation: Patient has been orientated to the room, unit and staff.  Family:  None at bedside.  Patient only has cell phone and clothes at bedside.  Orders have been reviewed and implemented. Will continue to monitor the patient. Call light has been placed within reach and bed alarm has been activated.   Earleen Reaper RN- [place], [place] Phone number: [phone]

## 2015-09-23 NOTE — Progress Notes (Addendum)
[facility]  transfer Denise Nunez  is a 35 year old female with without significant past medical history. Currently on period. Hemoglobin 5.1 on admission. Exact source unknown. Rectal exam advised to be done prior to transport. Vital signs otherwise stable. Transferring to a MedSurg bed. Will need to transfuse packed red blood cells upon arrival.

## 2015-09-23 NOTE — H&P (Signed)
History and Physical    Denise Nunez [id number] DOB: 09-24-1980 DOA: 09/22/2015  Referring Provider: Dr. Florina Ou (EDP) PCP: Glory Buff, MD  Outpatient Specialists: None listed  Patient coming from: Home  Chief Complaint: Fatigue, malaise, lightheadedness  HPI: Denise Nunez is a 35 y.o. female with medical history significant for occasional headaches and indigestion, managed with Tylenol and Pepcid, respectively, who presents in transfer from [facility] with fatigue, malaise, lightheadedness, nausea, early satiety, and indigestion for the past 2 weeks. Patient reports that she usually enjoys good health and takes no medications regularly other than iron supplement. She had blood work performed approximately 18 months ago and was told that she had a mild anemia at that time and was advised to use iron supplement. She has regular periods which last approximately 5-7 days and is currently menstruating. She reports that they are not particularly heavy. Her symptoms began insidiously approximately 2 weeks ago and have progressively worsened. She denies any fevers, chills, chest pain, or abdominal pain. She has nausea, but no vomiting or diarrhea. She reports that her stools have been dark, but she had attributed this to iron supplementation. She denies any hematochezia. Denies use of NSAIDs or alcohol.   ED Course: Upon arrival to the ED, patient is found to be afebrile, saturating well on room air, and with vital signs stable. CBC revealed a hemoglobin of 5.5 with a normal MCV. Teardrop cells are noted on the peripheral smear. Chemistry panel is unremarkable. Transfer to [facility] for admission and transfusion was accepted. Patient is interviewed and examined on the medical/surgical unit at [facility] where she remains afebrile and with stable vitals. She will be transfused 2 units of packed red blood cells and started on Protonix as there is concern that her GI symptoms may  represent PUD.  Review of Systems:  All other systems reviewed and apart from HPI, are negative.  Past Medical History  Diagnosis Date  . UTI (urinary tract infection)     History reviewed. No pertinent past surgical history.   reports that she has quit smoking. She does not have any smokeless tobacco history on file. She reports that she does not drink alcohol or use illicit drugs.  Allergies  Allergen Reactions  . Bactrim [Sulfamethoxazole-Trimethoprim] Rash    History reviewed. No pertinent family history.   Prior to Admission medications   Not on File    Physical Exam: Filed Vitals:   09/22/15 2302 09/22/15 2305 09/22/15 2306 09/23/15 0444  BP: 103/61 116/76 115/76 99/63  Pulse: 72 73 83 81  Temp:    99.2 F (37.3 C)  TempSrc:    Oral  Resp:   18 19  Height:      Weight:      SpO2: 100% 98% 100% 100%      Constitutional: NAD, calm, comfortable Eyes: PERTLA, lids and conjunctivae normal ENMT: Mucous membranes are moist. Posterior pharynx clear of any exudate or lesions.    Neck: normal, supple, no masses, no thyromegaly Respiratory: clear to auscultation bilaterally, no wheezing, no crackles. Normal respiratory effort.    Cardiovascular: S1 & S2 heard, regular rate and rhythm, no murmurs / rubs / gallops. No extremity edema. 2+ pedal pulses. No carotid bruits.  Abdomen: No distension, no tenderness, no masses palpated. Bowel sounds normal.  Musculoskeletal: no clubbing / cyanosis. No joint deformity upper and lower extremities. Normal muscle tone.  Skin: no rashes, lesions, ulcers. No induration Neurologic: CN 2-12 grossly intact. Sensation intact,  DTR normal. Strength 5/5 in all 4 limbs.  Psychiatric: Normal judgment and insight. Alert and oriented x 3. Normal mood.     Labs on Admission: I have personally reviewed following labs and imaging studies  CBC:  Recent Labs Lab 09/23/15 0006  WBC 4.6  NEUTROABS 1.6*  HGB 5.1*  HCT 15.7*  MCV 85.8  PLT  0000000   Basic Metabolic Panel:  Recent Labs Lab 09/23/15 0006  NA 135  K 3.5  CL 107  CO2 24  GLUCOSE 88  BUN 12  CREATININE 0.84  CALCIUM 9.0   GFR: Estimated Creatinine Clearance: 86.5 mL/min (by C-G formula based on Cr of 0.84). Liver Function Tests: No results for input(s): AST, ALT, ALKPHOS, BILITOT, PROT, ALBUMIN in the last 168 hours. No results for input(s): LIPASE, AMYLASE in the last 168 hours. No results for input(s): AMMONIA in the last 168 hours. Coagulation Profile: No results for input(s): INR, PROTIME in the last 168 hours. Cardiac Enzymes: No results for input(s): CKTOTAL, CKMB, CKMBINDEX, TROPONINI in the last 168 hours. BNP (last 3 results) No results for input(s): PROBNP in the last 8760 hours. HbA1C: No results for input(s): HGBA1C in the last 72 hours. CBG: No results for input(s): GLUCAP in the last 168 hours. Lipid Profile: No results for input(s): CHOL, HDL, LDLCALC, TRIG, CHOLHDL, LDLDIRECT in the last 72 hours. Thyroid Function Tests: No results for input(s): TSH, T4TOTAL, FREET4, T3FREE, THYROIDAB in the last 72 hours. Anemia Panel: No results for input(s): VITAMINB12, FOLATE, FERRITIN, TIBC, IRON, RETICCTPCT in the last 72 hours. Urine analysis:    Component Value Date/Time   COLORURINE YELLOW 09/22/2015 [place] 09/22/2015 2235   LABSPEC 1.007 09/22/2015 2235   PHURINE 6.0 09/22/2015 2235   GLUCOSEU NEGATIVE 09/22/2015 2235   HGBUR MODERATE* 09/22/2015 2235   BILIRUBINUR NEGATIVE 09/22/2015 2235   KETONESUR NEGATIVE 09/22/2015 2235   PROTEINUR NEGATIVE 09/22/2015 2235   UROBILINOGEN 1.0 02/05/2014 1600   NITRITE NEGATIVE 09/22/2015 2235   LEUKOCYTESUR NEGATIVE 09/22/2015 2235   Sepsis Labs: @LABRCNTIP (procalcitonin:4,lacticidven:4) )No results found for this or any previous visit (from the past 240 hour(s)).   Radiological Exams on Admission: No results found.  EKG:  None available, will obtain as appropriate.     Assessment/Plan  1. Symptomatic anemia  - Hgb 5.5 with normal MCV; teardrop cells noted  - Pt currently menstruating, but reports that not particularly heavy  - Previously started on iron supplementation when Hgb found to be 11.5 in September 2015  - FOBT is positive and there is some concern that her upper GI sxs could reflect ulcer disease  - 2 units pRBCs ordered for immediate transfusion  - RN asked to place order for post-transfusion H/H  - Protonix 80 mg IV now and then 40 mg IV q12h  - Monitor on telemetry   2. Indigestion, nausea, early satiety  - Pt reports history of "ulcer," but has never had endoscopy  - She takes Pepcid on prn basis at home  - Protonix 80 mg IV given now, plan to continue 40 mg IV BID    3. Headache  - Same character as her typical HA  - Usually responds to APAP at home - APAP prn     DVT prophylaxis: SCD  Code Status: Full  Family Communication: None available Disposition Plan: Admit to telemetry   Consults called: None  Admission status: Inpatient    Vianne Bulls MD [facility] Pager [phone]  If 7PM-7AM,  please contact night-coverage [email or web address] Password [facility]  09/23/2015, 6:41 AM

## 2015-09-23 NOTE — Progress Notes (Signed)
Initial Nutrition Assessment  DOCUMENTATION CODES:   Severe malnutrition in context of acute illness/injury  INTERVENTION:  Continue Boost Breeze po TID, each supplement provides 250 kcal and 9 grams of protein.  Encourage adequate PO intake.   NUTRITION DIAGNOSIS:   Malnutrition related to acute illness as evidenced by energy intake < or equal to 50% for > or equal to 5 days, percent weight loss.  GOAL:   Patient will meet greater than or equal to 90% of their needs  MONITOR:   PO intake, Supplement acceptance, Weight trends, Labs, I & O's  REASON FOR ASSESSMENT:   Malnutrition Screening Tool    ASSESSMENT:   35 y.o. female with medical history significant for occasional headaches and indigestion, managed with Tylenol and Pepcid, respectively, who presents in transfer from [facility] with fatigue, malaise, lightheadedness, nausea, early satiety, and indigestion for the past 2 weeks.  Diet has been advanced to a regular diet. Meal completion 60% this AM on a clear liquid diet. Pt reports having a decreased appetite which has been ongoing over the past 1 month. Pt reports she has been only able to take a couple bites out of food at meals and has been experiencing early satiety. Usual body weight reported to be ~160 lbs last weighing once month ago. Pt with a 6.25% weight loss in 1 month. Pt currently has Boost Breeze ordered and has been consuming them. RD to continue with current orders. Pt was educated on continuation of a nutritional supplement of her choice post discharge especially if po intake is poor.   Pt with no observed significant fat or muscle mass loss.   Labs and medications reviewed.   Diet Order:  Diet regular Room service appropriate?: Yes; Fluid consistency:: Thin  Skin:  Reviewed, no issues  Last BM:  4/28  Height:   Ht Readings from Last 1 Encounters:  09/22/15 5\' 5"  (1.651 m)    Weight:   Wt Readings from Last 1 Encounters:   09/22/15 150 lb (68.04 kg)    Ideal Body Weight:  56.8 kg  BMI:  Body mass index is 24.96 kg/(m^2).  Estimated Nutritional Needs:   Kcal:  1700-2000  Protein:  75-85 grams  Fluid:  1.7 - 2 L/day  EDUCATION NEEDS:   Education needs addressed  Corrin Parker, MS, RD, LDN Pager # [phone] After hours/ weekend pager # [phone]

## 2015-09-23 NOTE — ED Notes (Signed)
L ear irrigated, EDPA to bedside prior to irrigation of R ear, and DC'd.

## 2015-09-23 NOTE — ED Notes (Signed)
Patient may transport BLS. Shift Commander states he can send a [facility] truck to transport patient.

## 2015-09-24 DIAGNOSIS — E43 Unspecified severe protein-calorie malnutrition: Secondary | ICD-10-CM | POA: Insufficient documentation

## 2015-09-24 DIAGNOSIS — K219 Gastro-esophageal reflux disease without esophagitis: Secondary | ICD-10-CM

## 2015-09-24 DIAGNOSIS — R195 Other fecal abnormalities: Secondary | ICD-10-CM

## 2015-09-24 DIAGNOSIS — D649 Anemia, unspecified: Secondary | ICD-10-CM

## 2015-09-24 LAB — IRON AND TIBC
IRON: 181 ug/dL — AB (ref 28–170)
Saturation Ratios: 81 % — ABNORMAL HIGH (ref 10.4–31.8)
TIBC: 223 ug/dL — ABNORMAL LOW (ref 250–450)
UIBC: 42 ug/dL

## 2015-09-24 LAB — C-REACTIVE PROTEIN: CRP: <0.5 mg/dL (ref <1.0)

## 2015-09-24 LAB — COMPREHENSIVE METABOLIC PANEL
ALBUMIN: 3.9 g/dL (ref 3.5–5.0)
ALK PHOS: 36 U/L — AB (ref 38–126)
ALT: 35 U/L (ref 14–54)
ANION GAP: 8 (ref 5–15)
AST: 59 U/L — ABNORMAL HIGH (ref 15–41)
BILIRUBIN TOTAL: 1 mg/dL (ref 0.3–1.2)
BUN: 13 mg/dL (ref 6–20)
CALCIUM: 9.5 mg/dL (ref 8.9–10.3)
CO2: 25 mmol/L (ref 22–32)
Chloride: 106 mmol/L (ref 101–111)
Creatinine, Ser: 0.92 mg/dL (ref 0.44–1.00)
GFR calc Af Amer: >60 mL/min (ref >60)
GFR calc non Af Amer: >60 mL/min (ref >60)
GLUCOSE: 128 mg/dL — AB (ref 65–99)
Potassium: 3.7 mmol/L (ref 3.5–5.1)
Sodium: 139 mmol/L (ref 135–145)
TOTAL PROTEIN: 6.8 g/dL (ref 6.5–8.1)

## 2015-09-24 LAB — FERRITIN: FERRITIN: 313 ng/mL — AB (ref 11–307)

## 2015-09-24 LAB — CBC
HCT: 25.5 % — ABNORMAL LOW (ref 36.0–46.0)
Hemoglobin: 8.6 g/dL — ABNORMAL LOW (ref 12.0–15.0)
MCH: 27.3 pg (ref 26.0–34.0)
MCHC: 33.7 g/dL (ref 30.0–36.0)
MCV: 81 fL (ref 78.0–100.0)
PLATELETS: 215 10*3/uL (ref 150–400)
RBC: 3.15 MIL/uL — ABNORMAL LOW (ref 3.87–5.11)
RDW: 23 % — AB (ref 11.5–15.5)
WBC: 4.2 10*3/uL (ref 4.0–10.5)

## 2015-09-24 LAB — VITAMIN B12: Vitamin B-12: 88 pg/mL — ABNORMAL LOW (ref 180–914)

## 2015-09-24 LAB — GLUCOSE, CAPILLARY: Glucose-Capillary: 94 mg/dL (ref 65–99)

## 2015-09-24 LAB — RETICULOCYTES
RBC.: 3.13 MIL/uL — AB (ref 3.87–5.11)
RETIC COUNT ABSOLUTE: 15.7 10*3/uL — AB (ref 19.0–186.0)
Retic Ct Pct: 0.5 % (ref 0.4–3.1)

## 2015-09-24 LAB — SEDIMENTATION RATE: Sed Rate: 3 mm/hr (ref 0–22)

## 2015-09-24 LAB — FOLATE: Folate: 16.9 ng/mL (ref >5.9)

## 2015-09-24 MED ORDER — SODIUM CHLORIDE 0.9 % IV SOLN
INTRAVENOUS | Status: AC
  Administered 2015-09-24 – 2015-09-25 (×2): via INTRAVENOUS

## 2015-09-24 NOTE — Progress Notes (Signed)
PROGRESS NOTE    Denise Nunez  [id number] DOB: [birth year] DOA: 09/22/2015 PCP: Reports none, needs to follow up with new PCP  Brief Narrative:  Denise Nunez is a 35 y.o. female with medical history significant for occasional headaches and indigestion, managed with Tylenol and Pepcid, respectively, who presents in transfer from [facility] with fatigue, malaise, lightheadedness, nausea, early satiety, and indigestion for the past 2 weeks. Patient reports that she usually enjoys good health and takes no medications regularly other than iron supplement. She had blood work performed approximately 18 months ago and was told that she had a mild anemia at that time and was advised to use iron supplement. She has regular periods which last approximately 5-7 days and is currently menstruating. She reports that they are not particularly heavy. Her symptoms began insidiously approximately 2 weeks ago and have progressively worsened. She denies any fevers, chills, chest pain, or abdominal pain. She has nausea, but no vomiting or diarrhea. She reports that her stools have been dark, but she had attributed this to iron supplementation. She denies any hematochezia. Denies use of NSAIDs or alcohol.    Assessment & Plan:   Symptomatic anemia - hgb improved with 2 Units PRBC - Ferritin high, retic low.  Agree with work up for other causes of anemia, could be Thalassemia or anemia of chronic inflammation.   - Request smear, check hemoglobin electrophoresis, inflammatory markers - Further outpatient work up if Hgb stays at acceptable level. - She has no jaundice on exam.  - TV ultrasound negative for fibroid  GI symptoms, early satiety, FOBT+ for blood - Plan for EGD tomorrow with GI - NPO at midnight  Headache - Similar to previous headaches, global, possibly from caffeine withdrawal - Vicoden prn   Diet: Regular, NPO at midnight   DVT prophylaxis: SCDs  Code Status: Full   Disposition  Plan: EGD tomorrow, likely discharge tomorrow    Consultants:   GI  Procedures:   none  Antimicrobials:   none    Subjective: Only complaint is headache this morning.  No noticed blood loss.   Objective: Filed Vitals:   09/23/15 2103 09/24/15 0503 09/24/15 0826 09/24/15 1650  BP: 105/60 99/53 105/53 78/43  Pulse: 79 64 69 58  Temp: 98.8 F (37.1 C) 98.4 F (36.9 C) 98.5 F (36.9 C) 98.7 F (37.1 C)  TempSrc: Oral  Oral Oral  Resp: 18 20 18 17   Height:      Weight:      SpO2: 100% 100% 100% 100%    Intake/Output Summary (Last 24 hours) at 09/24/15 1757 Last data filed at 09/24/15 1411  Gross per 24 hour  Intake   1380 ml  Output      0 ml  Net   1380 ml   Filed Weights   09/22/15 2056  Weight: 150 lb (68.04 kg)    Examination:  General exam: Appears calm and comfortable Eyes: No jaundice, + scleral icterus  Respiratory system: Clear to auscultation. Respiratory effort normal. Cardiovascular system: S1 & S2 heard, RR, NR.  Gastrointestinal system: Abdomen is nondistended, soft and nontender.  Normal bowel sounds heard. Central nervous system: Alert and oriented. No focal neurological deficits. Extremities: Symmetric 5 x 5 power. Psychiatry: Judgement and insight appear normal. Mood & affect appropriate.     Data Reviewed: I have personally reviewed following labs and imaging studies  CBC:  Recent Labs Lab 09/23/15 0006 09/23/15 2252 09/24/15 0832  WBC 4.6  --  4.2  NEUTROABS 1.6*  --   --   HGB 5.1* 7.9* 8.6*  HCT 15.7* 23.8* 25.5*  MCV 85.8  --  81.0  PLT 261  --  123456   Basic Metabolic Panel:  Recent Labs Lab Oct 06, 2015 0006  NA 135  K 3.5  CL 107  CO2 24  GLUCOSE 88  BUN 12  CREATININE 0.84  CALCIUM 9.0  CBG:  Recent Labs Lab 2015/10/06 0803 09/24/15 0823  GLUCAP 88 94   Anemia Panel:  Recent Labs  09/24/15 1014  VITAMINB12 88*  FOLATE 16.9  FERRITIN 313*  TIBC 223*  IRON 181*  RETICCTPCT 0.5   Urine analysis:     Component Value Date/Time   COLORURINE YELLOW 09/22/2015 [place] 09/22/2015 2235   LABSPEC 1.007 09/22/2015 2235   PHURINE 6.0 09/22/2015 2235   GLUCOSEU NEGATIVE 09/22/2015 2235   HGBUR MODERATE* 09/22/2015 2235   BILIRUBINUR NEGATIVE 09/22/2015 2235   KETONESUR NEGATIVE 09/22/2015 2235   PROTEINUR NEGATIVE 09/22/2015 2235   UROBILINOGEN 1.0 02/05/2014 1600   NITRITE NEGATIVE 09/22/2015 2235   LEUKOCYTESUR NEGATIVE 09/22/2015 2235     Radiology Studies: US Transvaginal Non-ob  Oct 06, 2015  CLINICAL DATA:  Patient with anemia. History of urinary tract infection. EXAM: TRANSABDOMINAL AND TRANSVAGINAL ULTRASOUND OF PELVIS TECHNIQUE: Both transabdominal and transvaginal ultrasound examinations of the pelvis were performed. Transabdominal technique was performed for global imaging of the pelvis including uterus, ovaries, adnexal regions, and pelvic cul-de-sac. It was necessary to proceed with endovaginal exam following the transabdominal exam to visualize the adnexal structures. COMPARISON:  None FINDINGS: Uterus Measurements: 8.5 x 4.2 x 5.1 cm. No fibroids or other mass visualized. Endometrium Thickness: 4 mm.  No focal abnormality visualized. Right ovary Measurements: 2.7 x 1.6 x 1.6 cm. There is a 1.1 x 1.0 x 1.1 cm paraovarian cyst. Left ovary Measurements: 3.4 x 1.9 x 2.2 cm. Normal appearance/no adnexal mass. Other findings No abnormal free fluid. IMPRESSION: Unremarkable pelvic ultrasound. Electronically Signed   By: Lovey Newcomer M.D.   On: 10/06/2015 14:41   US Pelvis Complete  Oct 06, 2015  CLINICAL DATA:  Patient with anemia. History of urinary tract infection. EXAM: TRANSABDOMINAL AND TRANSVAGINAL ULTRASOUND OF PELVIS TECHNIQUE: Both transabdominal and transvaginal ultrasound examinations of the pelvis were performed. Transabdominal technique was performed for global imaging of the pelvis including uterus, ovaries, adnexal regions, and pelvic cul-de-sac. It was necessary  to proceed with endovaginal exam following the transabdominal exam to visualize the adnexal structures. COMPARISON:  None FINDINGS: Uterus Measurements: 8.5 x 4.2 x 5.1 cm. No fibroids or other mass visualized. Endometrium Thickness: 4 mm.  No focal abnormality visualized. Right ovary Measurements: 2.7 x 1.6 x 1.6 cm. There is a 1.1 x 1.0 x 1.1 cm paraovarian cyst. Left ovary Measurements: 3.4 x 1.9 x 2.2 cm. Normal appearance/no adnexal mass. Other findings No abnormal free fluid. IMPRESSION: Unremarkable pelvic ultrasound. Electronically Signed   By: Lovey Newcomer M.D.   On: 10-06-2015 14:41     Scheduled Meds: . feeding supplement  1 Container Oral TID BM  . pantoprazole (PROTONIX) IV  40 mg Intravenous Q12H  . sodium chloride flush  3 mL Intravenous Q12H  . sodium chloride flush  3 mL Intravenous Q12H   Continuous Infusions:    LOS: 1 day    Time spent: 35 minutes    Gilles Chiquito, MD [facility] Pager [phone]  If 7PM-7AM, please contact night-coverage [email or web address] Password [facility] 09/24/2015, 5:57 PM

## 2015-09-24 NOTE — Consult Note (Signed)
Consultation  Referring Provider: [facility] Primary Care Physician:  Glory Buff, MD Primary Gastroenterologist:  None/unassigned  Reason for Consultation:   Severe anemia/heme+ stool  HPI: Denise Nunez is a 35 y.o. female who was admitted through the emergency room last evening after presenting with complaint of progressive dizziness over the past 2 weeks. She says she has been feeling weak over the past couple of months but had gotten acutely worse over the past few weeks. In the emergency room she was noted to be profoundly anemic with hemoglobin of 5.1 hematocrit of 15.7, normal MCV of 85. Stool was sent for Hemoccult and was positive however patient is currently on her menstrual cycle. She says that she was told that she was iron deficient many years ago and believes that she has been anemic all of her adult life. She has been off and on over-the-counter iron supplements since that time. She has been taking OTC vitamin with iron over the past year or so and says that her stools stay on the dark side. She has not noted any changes in her bowel habits though tends to be constipated has not noted any melena or hematochezia. She has had some intermittent heartburn and indigestion. She had an ER visit a year or so ago with epigastric pain and was told she may have an ulcer. She has never had GI evaluation. She was told at that time to take Pepcid in his state on over-the-counter Pepcid since intermittently when she's having symptoms. She does not take any regular aspirin but does take occasional Aleve. Over the past few weeks she's been having a headache and has had 2-4 Aleve per day.  Family history negative for GI disease, negative for thalassemia and sickle cells are she is aware. [facility]line Reviewing patient's chart shows September 2015 hemoglobin 11.5 hematocrit of 34 and MCV of 78 and in 2013 hemoglobin was 8.8 hematocrit of 26. Pelvic ultrasound this admission negative CBC with  differential last evening showed positive teardrops and schistocytes   Past Medical History  Diagnosis Date  . UTI (urinary tract infection)     History reviewed. No pertinent past surgical history.  Prior to Admission medications   Not on File    Current Facility-Administered Medications  Medication Dose Route Frequency Provider Last Rate Last Dose  . 0.9 %  sodium chloride infusion  250 mL Intravenous PRN Vianne Bulls, MD      . acetaminophen (TYLENOL) tablet 650 mg  650 mg Oral Q6H PRN Vianne Bulls, MD   650 mg at 09/23/15 1618   Or  . acetaminophen (TYLENOL) suppository 650 mg  650 mg Rectal Q6H PRN Vianne Bulls, MD      . feeding supplement (BOOST / RESOURCE BREEZE) liquid 1 Container  1 Container Oral TID BM Vianne Bulls, MD   1 Container at 09/23/15 1352  . HYDROcodone-acetaminophen (NORCO/VICODIN) 5-325 MG per tablet 1-2 tablet  1-2 tablet Oral Q4H PRN Vianne Bulls, MD   2 tablet at 09/24/15 0845  . pantoprazole (PROTONIX) injection 40 mg  40 mg Intravenous Q12H Vianne Bulls, MD   40 mg at 09/24/15 0846  . sodium chloride flush (NS) 0.9 % injection 3 mL  3 mL Intravenous Q12H Ilene Qua Opyd, MD   0 mL at 09/23/15 1000  . sodium chloride flush (NS) 0.9 % injection 3 mL  3 mL Intravenous Q12H Vianne Bulls, MD   3 mL at 09/24/15 0848  .  sodium chloride flush (NS) 0.9 % injection 3 mL  3 mL Intravenous PRN Vianne Bulls, MD        Allergies as of 09/22/2015 - Review Complete 09/22/2015  Allergen Reaction Noted  . Bactrim [sulfamethoxazole-trimethoprim] Rash 05/05/2012    History reviewed. No pertinent family history.  Social History   Social History  . Marital Status: Single    Spouse Name: N/A  . Number of Children: N/A  . Years of Education: N/A   Occupational History  . Not on file.   Social History Main Topics  . Smoking status: Former Research scientist (life sciences)  . Smokeless tobacco: Not on file  . Alcohol Use: No  . Drug Use: No  . Sexual Activity: No   Other  Topics Concern  . Not on file   Social History Narrative    Review of Systems: Pertinent positive and negative review of systems were noted in the above HPI section.  All other review of systems was otherwise negative.  Physical Exam: Vital signs in last 24 hours: Temp:  [98.4 F (36.9 C)-99.2 F (37.3 C)] 98.5 F (36.9 C) (04/30 0826) Pulse Rate:  [64-79] 69 (04/30 0826) Resp:  [17-20] 18 (04/30 0826) BP: (90-105)/(44-60) 105/53 mmHg (04/30 0826) SpO2:  [100 %] 100 % (04/30 0826) Last BM Date: 09/22/15 General:   Alert,  Well-developed,young AA female , well-nourished, pleasant and cooperative in NAD Head:  Normocephalic and atraumatic. Eyes:  Sclera clear, no icterus.   Conjunctiva pink. Ears:  Normal auditory acuity. Nose:  No deformity, discharge,  or lesions. Mouth:  No deformity or lesions.   Neck:  Supple; no masses or thyromegaly. Lungs:  Clear throughout to auscultation.   No wheezes, crackles, or rhonchi. Heart:  Regular rate and rhythm; no murmurs, clicks, rubs,  or gallops. Abdomen:  Soft,nontender, BS active,nonpalp mass or hsm.   Rectal:  Documented heme + but menstruating Msk:  Symmetrical without gross deformities. . Pulses:  Normal pulses noted. Extremities:  Without clubbing or edema. Neurologic:  Alert and  oriented x4;  grossly normal neurologically. Skin:  Intact without significant lesions or rashes.. Psych:  Alert and cooperative. Normal mood and affect.  Intake/Output from previous day: 04/29 0701 - 04/30 0700 In: 1650 [P.O.:960; Blood:690] Out: 0  Intake/Output this shift:    Lab Results:  Recent Labs  09/23/15 0006 09/23/15 2252 09/24/15 0832  WBC 4.6  --  PENDING  HGB 5.1* 7.9* 8.6*  HCT 15.7* 23.8* 25.5*  PLT 261  --  PENDING   BMET  Recent Labs  09/23/15 0006  NA 135  K 3.5  CL 107  CO2 24  GLUCOSE 88  BUN 12  CREATININE 0.84  CALCIUM 9.0   LFT No results for input(s): PROT, ALBUMIN, AST, ALT, ALKPHOS, BILITOT,  BILIDIR, IBILI in the last 72 hours. PT/INR No results for input(s): LABPROT, INR in the last 72 hours. Hepatitis Panel No results for input(s): HEPBSAG, HCVAB, HEPAIGM, HEPBIGM in the last 72 hours.    IMPRESSION:   #29  35 year old African-American female admitted with profound anemia, normocytic and documented Hemoccult-positive- however question accuracy as she is menstruating. Patient symptomatic with dizziness weakness and headaches over the past several weeks. Review of record showed that she has had a chronic anemia. I doubt her anemia is of primary GI etiology. She has teardrops and schistocytes on differential. She needs to be worked up for underlying hematologic disorder. Question myelodysplastic process #2 intermittent heartburn and indigestion-will rule out gastritis/peptic ulcer disease  Plan  Will schedule for EGD tomorrow with Dr. Silverio Decamp. Procedure discussed in detail with the patient and she is agreeable to proceed Nothing by mouth after midnight Cover with Protonix for now as  doing Have ordered baseline anemia panel but patient needs further hematologic workup  Amy Esterwood  09/24/2015, 9:42 AM   GI ATTENDING  History, laboratories reviewed. Patient personally seen and examined. Whereas see patient for severe anemia and Hemoccult-positive stool. No acute or subacute GI bleeding. Has had chronic intermittent problems with anemia. Peripheral smear shows schistocytes and teardrop cells raising the question of primary bone marrow disorder. Self-reported history of peptic ulcer disease. This point we'll proceed with upper endoscopy to rule out upper GI mucosal lesion to explain anemia and Hemoccult-positive stool. Regardless, I feel that hematology consultation is appropriate and recommended. We may also decide to proceed with colonoscopy, but we'll proceed with upper endoscopy first. Discussed with patient  Docia Chuck. Geri Seminole., M.D. [facility] Division of  Gastroenterology

## 2015-09-25 ENCOUNTER — Inpatient Hospital Stay (HOSPITAL_COMMUNITY): Payer: BLUE CROSS/BLUE SHIELD | Admitting: Anesthesiology

## 2015-09-25 ENCOUNTER — Encounter (HOSPITAL_COMMUNITY): Payer: Self-pay | Admitting: Gastroenterology

## 2015-09-25 ENCOUNTER — Encounter (HOSPITAL_COMMUNITY)
Admission: EM | Disposition: A | Discharge status: Home / Self Care | Payer: Self-pay | Source: Home / Self Care | Attending: Internal Medicine

## 2015-09-25 DIAGNOSIS — D649 Anemia, unspecified: Secondary | ICD-10-CM | POA: Insufficient documentation

## 2015-09-25 HISTORY — PX: ESOPHAGOGASTRODUODENOSCOPY (EGD) WITH PROPOFOL: SHX5813

## 2015-09-25 LAB — GLUCOSE, CAPILLARY: GLUCOSE-CAPILLARY: 80 mg/dL (ref 65–99)

## 2015-09-25 LAB — CBC
HEMATOCRIT: 22 % — AB (ref 36.0–46.0)
HEMOGLOBIN: 7.3 g/dL — AB (ref 12.0–15.0)
MCH: 27.1 pg (ref 26.0–34.0)
MCHC: 33.2 g/dL (ref 30.0–36.0)
MCV: 81.8 fL (ref 78.0–100.0)
PLATELETS: 180 10*3/uL (ref 150–400)
RBC: 2.69 MIL/uL — AB (ref 3.87–5.11)
RDW: 22.8 % — ABNORMAL HIGH (ref 11.5–15.5)
WBC: 4 10*3/uL (ref 4.0–10.5)

## 2015-09-25 LAB — PREPARE RBC (CROSSMATCH)

## 2015-09-25 SURGERY — ESOPHAGOGASTRODUODENOSCOPY (EGD) WITH PROPOFOL
Anesthesia: Monitor Anesthesia Care

## 2015-09-25 MED ORDER — LACTATED RINGERS IV SOLN
INTRAVENOUS | Status: DC
  Administered 2015-09-25: 1000 mL via INTRAVENOUS

## 2015-09-25 MED ORDER — BUTAMBEN-TETRACAINE-BENZOCAINE 2-2-14 % EX AERO
INHALATION_SPRAY | CUTANEOUS | Status: DC | PRN
  Administered 2015-09-25: 2 via TOPICAL

## 2015-09-25 MED ORDER — CYANOCOBALAMIN 1000 MCG/ML IJ SOLN
1000.0000 ug | Freq: Every day | INTRAMUSCULAR | Status: DC
  Administered 2015-09-25 – 2015-09-26 (×2): 1000 ug via INTRAMUSCULAR
  Filled 2015-09-25 (×2): qty 1

## 2015-09-25 MED ORDER — SODIUM CHLORIDE 0.9 % IV SOLN
Freq: Once | INTRAVENOUS | Status: AC
  Administered 2015-09-25: 15:00:00 via INTRAVENOUS

## 2015-09-25 MED ORDER — PROPOFOL 500 MG/50ML IV EMUL
INTRAVENOUS | Status: DC | PRN
  Administered 2015-09-25: 125 ug/kg/min via INTRAVENOUS

## 2015-09-25 MED ORDER — PROPOFOL 10 MG/ML IV BOLUS
INTRAVENOUS | Status: DC | PRN
  Administered 2015-09-25: 20 mg via INTRAVENOUS
  Administered 2015-09-25: 30 mg via INTRAVENOUS

## 2015-09-25 NOTE — Op Note (Signed)
[facility] Patient Name: Denise Nunez Procedure Date : 09/25/2015 MRN: [id number] Attending MD: Mauri Pole , MD Date of Birth: July 30, 1980 CSN: [id number] Age: 35 Admit Type: Inpatient Procedure:                Upper GI endoscopy Indications:              Iron deficiency anemia secondary to chronic blood                            loss, Heme positive stool Providers:                Mauri Pole, MD, Dortha Schwalbe, RN,                            Corliss Parish, Technician Referring MD:              Medicines:                Monitored Anesthesia Care Complications:            No immediate complications. Estimated Blood Loss:     Estimated blood loss was minimal. Procedure:                Pre-Anesthesia Assessment:                           - Prior to the procedure, a History and Physical                            was performed, and patient medications and                            allergies were reviewed. The patient's tolerance of                            previous anesthesia was also reviewed. The risks                            and benefits of the procedure and the sedation                            options and risks were discussed with the patient.                            All questions were answered, and informed consent                            was obtained. Prior Anticoagulants: The patient has                            taken no previous anticoagulant or antiplatelet                            agents. ASA Grade Assessment: II - A patient with  mild systemic disease. After reviewing the risks                            and benefits, the patient was deemed in                            satisfactory condition to undergo the procedure.                           After obtaining informed consent, the endoscope was                            passed under direct vision. Throughout the                            procedure,  the patient's blood pressure, pulse, and                            oxygen saturations were monitored continuously. The                            [id number]) scope was introduced through the                            mouth, and advanced to the second part of duodenum.                            The upper GI endoscopy was accomplished without                            difficulty. The patient tolerated the procedure                            well. Scope In: Scope Out: Findings:      The examined esophagus was normal.      The entire examined stomach was normal.      The first portion of the duodenum and second portion of the duodenum       were normal. Biopsies for histology were taken with a cold forceps for       evaluation of celiac disease. Impression:               - Normal esophagus.                           - Normal stomach.                           - Normal first portion of the duodenum and second                            portion of the duodenum. Biopsied. Moderate Sedation:      N/A Recommendation:           - Resume previous diet.                           -  Continue present medications.                           - Await pathology results.                           - No repeat upper endoscopy.                           -Follow up hematology consult                           - Will sign off, please call with any questions                           - Return to GI office PRN. Procedure Code(s):        --- Professional ---                           [phone], Esophagogastroduodenoscopy, flexible,                            transoral; with biopsy, single or multiple Diagnosis Code(s):        --- Professional ---                           D50.0, Iron deficiency anemia secondary to blood                            loss (chronic)                           R19.5, Other fecal abnormalities CPT copyright 2016 [employer]. All rights reserved. The codes  documented in this report are preliminary and upon coder review may  be revised to meet current compliance requirements. Mauri Pole, MD 09/25/2015 12:28:26 PM This report has been signed electronically. Number of Addenda: 0

## 2015-09-25 NOTE — Progress Notes (Addendum)
PROGRESS NOTE                                                                                                                                                                                                             Patient Demographics:    Denise Nunez, is a 35 y.o. female, DOB - [birth year], ZOX:[id number]  Admit date - 09/22/2015   Admitting Physician Briscoe Deutscher, MD  Outpatient Primary MD for the patient is Alm Bustard, MD  LOS - 2  Outpatient Specialists:  Chief Complaint  Patient presents with  . Dizziness       Brief Narrative   35 year old female without significant past medical history presents with dizziness, workup significant for severe anemia with hemoglobin of 5.1, workup significant for Hemoccult positive stool, she endorses NSAID use, reports her menstrual period not heavy, workup significant for B-12 deficiency, and teardrop and schistocytes, plan for endoscopy today.  Subjective:    Denise Nunez today has, No headache, No chest pain, No abdominal pain - No Nausea, Reports she is feeling better today.   Assessment  & Plan :    Principal Problem:   Symptomatic anemia Active Problems:   Headache   Nausea without vomiting   Early satiety   Indigestion   Other fatigue   Shortness of breath   Protein-calorie malnutrition, severe   Absolute anemia  Symptomatic anemia - With hemoglobin of 5.1 on admission, workup significant for Hemoccult positive stool, teardrop cells, B-12 deficiency - Patient endorses NSAID use, FOBT positive, plan for endoscopy today by GI, continue with PPI - Significant B-12 deficiency, start on B-12 supplement, will obtain antiparietal cells antibody, intrinsic antibody, will need to follow-up with GI as an outpatient for possible pernicious anemia, Follow on potassium level in a.m.Marland Kitchen - Ferritin and iron level within normal range - Received 2 units PRBC, hemoglobin remained at 7.3 today, will transfuse  another unit      Code Status : Full  Family Communication  : none at bedside  Disposition Plan  : home   Barriers For Discharge :   Consults  :  GI  Procedures  : none, EGD plannwed  DVT Prophylaxis  : SCDs   Lab Results  Component Value Date   PLT 180 09/25/2015    Antibiotics  :    Anti-infectives  None        Objective:   Filed Vitals:   09/24/15 1959 09/25/15 0512 09/25/15 0830 09/25/15 1135  BP: 102/67 97/56 106/65 105/64  Pulse: 61 61 60 62  Temp: 98.8 F (37.1 C) 97.4 F (36.3 C) 98.8 F (37.1 C) 98.2 F (36.8 C)  TempSrc:   Oral Oral  Resp: 18 20 18 17   Height:      Weight:      SpO2: 100% 100% 100% 100%    Wt Readings from Last 3 Encounters:  09/22/15 68.04 kg (150 lb)  02/05/14 75.433 kg (166 lb 4.8 oz)  08/11/13 74.844 kg (165 lb)     Intake/Output Summary (Last 24 hours) at 09/25/15 1227 Last data filed at 09/25/15 0600  Gross per 24 hour  Intake 2308.75 ml  Output      0 ml  Net 2308.75 ml     Physical Exam  Awake Alert, Oriented X 3, No new F.N deficits, Normal affect [place].AT,PERRAL Supple Neck,No JVD, No cervical lymphadenopathy appriciated.  Symmetrical Chest wall movement, Good air movement bilaterally, CTAB RRR,No Gallops,Rubs or new Murmurs, No Parasternal Heave +ve B.Sounds, Abd Soft, No tenderness, No organomegaly appriciated, No rebound - guarding or rigidity. No Cyanosis, Clubbing or edema, No new Rash or bruise      Data Review:    CBC  Recent Labs Lab 09/23/15 0006 09/23/15 2252 09/24/15 0832 09/25/15 0530  WBC 4.6  --  4.2 4.0  HGB 5.1* 7.9* 8.6* 7.3*  HCT 15.7* 23.8* 25.5* 22.0*  PLT 261  --  215 180  MCV 85.8  --  81.0 81.8  MCH 27.9  --  27.3 27.1  MCHC 32.5  --  33.7 33.2  RDW 24.4*  --  23.0* 22.8*  LYMPHSABS 2.7  --   --   --   MONOABS 0.2  --   --   --   EOSABS 0.1  --   --   --   BASOSABS 0.0  --   --   --     Chemistries   Recent Labs Lab 09/23/15 0006 09/24/15 1832  NA 135  139  K 3.5 3.7  CL 107 106  CO2 24 25  GLUCOSE 88 128*  BUN 12 13  CREATININE 0.84 0.92  CALCIUM 9.0 9.5  AST  --  59*  ALT  --  35  ALKPHOS  --  36*  BILITOT  --  1.0   ------------------------------------------------------------------------------------------------------------------ No results for input(s): CHOL, HDL, LDLCALC, TRIG, CHOLHDL, LDLDIRECT in the last 72 hours.  No results found for: HGBA1C ------------------------------------------------------------------------------------------------------------------ No results for input(s): TSH, T4TOTAL, T3FREE, THYROIDAB in the last 72 hours.  Invalid input(s): FREET3 ------------------------------------------------------------------------------------------------------------------  Recent Labs  09/24/15 1014  VITAMINB12 88*  FOLATE 16.9  FERRITIN 313*  TIBC 223*  IRON 181*  RETICCTPCT 0.5    Coagulation profile No results for input(s): INR, PROTIME in the last 168 hours.  No results for input(s): DDIMER in the last 72 hours.  Cardiac Enzymes No results for input(s): CKMB, TROPONINI, MYOGLOBIN in the last 168 hours.  Invalid input(s): CK ------------------------------------------------------------------------------------------------------------------ No results found for: BNP  Inpatient Medications  Scheduled Meds: . sodium chloride   Intravenous Once  . [MAR Hold] cyanocobalamin  1,000 mcg Intramuscular Daily  . [MAR Hold] feeding supplement  1 Container Oral TID BM  . [MAR Hold] pantoprazole (PROTONIX) IV  40 mg Intravenous Q12H  . [MAR Hold] sodium chloride flush  3 mL Intravenous Q12H  . [  MAR Hold] sodium chloride flush  3 mL Intravenous Q12H   Continuous Infusions: . lactated ringers 1,000 mL (09/25/15 1141)   PRN Meds:.[MAR Hold] sodium chloride, [MAR Hold] acetaminophen **OR** [MAR Hold] acetaminophen, [MAR Hold] HYDROcodone-acetaminophen, [MAR Hold] sodium chloride flush  Micro Results No results  found for this or any previous visit (from the past 240 hour(s)).  Radiology Reports US Transvaginal Non-ob  09/23/2015  CLINICAL DATA:  Patient with anemia. History of urinary tract infection. EXAM: TRANSABDOMINAL AND TRANSVAGINAL ULTRASOUND OF PELVIS TECHNIQUE: Both transabdominal and transvaginal ultrasound examinations of the pelvis were performed. Transabdominal technique was performed for global imaging of the pelvis including uterus, ovaries, adnexal regions, and pelvic cul-de-sac. It was necessary to proceed with endovaginal exam following the transabdominal exam to visualize the adnexal structures. COMPARISON:  None FINDINGS: Uterus Measurements: 8.5 x 4.2 x 5.1 cm. No fibroids or other mass visualized. Endometrium Thickness: 4 mm.  No focal abnormality visualized. Right ovary Measurements: 2.7 x 1.6 x 1.6 cm. There is a 1.1 x 1.0 x 1.1 cm paraovarian cyst. Left ovary Measurements: 3.4 x 1.9 x 2.2 cm. Normal appearance/no adnexal mass. Other findings No abnormal free fluid. IMPRESSION: Unremarkable pelvic ultrasound. Electronically Signed   By: Annia Belt M.D.   On: 09/23/2015 14:41   US Pelvis Complete  09/23/2015  CLINICAL DATA:  Patient with anemia. History of urinary tract infection. EXAM: TRANSABDOMINAL AND TRANSVAGINAL ULTRASOUND OF PELVIS TECHNIQUE: Both transabdominal and transvaginal ultrasound examinations of the pelvis were performed. Transabdominal technique was performed for global imaging of the pelvis including uterus, ovaries, adnexal regions, and pelvic cul-de-sac. It was necessary to proceed with endovaginal exam following the transabdominal exam to visualize the adnexal structures. COMPARISON:  None FINDINGS: Uterus Measurements: 8.5 x 4.2 x 5.1 cm. No fibroids or other mass visualized. Endometrium Thickness: 4 mm.  No focal abnormality visualized. Right ovary Measurements: 2.7 x 1.6 x 1.6 cm. There is a 1.1 x 1.0 x 1.1 cm paraovarian cyst. Left ovary Measurements: 3.4 x 1.9 x 2.2  cm. Normal appearance/no adnexal mass. Other findings No abnormal free fluid. IMPRESSION: Unremarkable pelvic ultrasound. Electronically Signed   By: Annia Belt M.D.   On: 09/23/2015 14:41    Time Spent in minutes  25 minutes   Yaman Grauberger M.D on 09/25/2015 at 12:27 PM  Between 7am to 7pm - Pager - [phone]  After 7pm go to [email or web address] - password [facility]  [facility] -  Office  [phone]

## 2015-09-25 NOTE — Transfer of Care (Signed)
Immediate Anesthesia Transfer of Care Note  Patient: Denise Nunez  Procedure(s) Performed: Procedure(s): ESOPHAGOGASTRODUODENOSCOPY (EGD) WITH PROPOFOL (N/A)  Patient Location: Endoscopy Unit  Anesthesia Type:MAC  Level of Consciousness: awake, alert  and oriented  Airway & Oxygen Therapy: Patient Spontanous Breathing  Post-op Assessment: Report given to RN and Post -op Vital signs reviewed and stable  Post vital signs: Reviewed and stable  Last Vitals:  Filed Vitals:   09/25/15 0830 09/25/15 1135  BP: 106/65 105/64  Pulse: 60 62  Temp: 37.1 C 36.8 C  Resp: 18 17    Last Pain:  Filed Vitals:   09/25/15 1136  PainSc: 0-No pain      Patients Stated Pain Goal: 0 ([id number])  Complications: No apparent anesthesia complications

## 2015-09-25 NOTE — Progress Notes (Signed)
Daily Rounding Note  09/25/2015, 9:58 AM  LOS: 2 days   SUBJECTIVE:       No dizziness, no tachycardia.  Feels ok.  No black, bloody or tarry stool.    OBJECTIVE:         Vital signs in last 24 hours:    Temp:  [97.4 F (36.3 C)-98.8 F (37.1 C)] 98.8 F (37.1 C) (05/01 0830) Pulse Rate:  [58-61] 60 (05/01 0830) Resp:  [17-20] 18 (05/01 0830) BP: (78-106)/(43-67) 106/65 mmHg (05/01 0830) SpO2:  [100 %] 100 % (05/01 0830) Last BM Date: 09/22/15 Filed Weights   09/22/15 2056  Weight: 68.04 kg (150 lb)   General: pleasant, looks well.    Heart: RRR Chest: clear bil.  No labored breathing Abdomen: soft, NT, ND.  Active BS.  No HSM  Extremities: no CCE Neuro/Psych:  Oriented x 3.  No limb weakness.  No tremor.   Intake/Output from previous day: 04/30 0701 - 05/01 0700 In: 2428.8 [P.O.:960; I.V.:1468.8] Out: 0   Intake/Output this shift:    Lab Results:  Recent Labs  09/23/15 0006 09/23/15 2252 09/24/15 0832 09/25/15 0530  WBC 4.6  --  4.2 4.0  HGB 5.1* 7.9* 8.6* 7.3*  HCT 15.7* 23.8* 25.5* 22.0*  PLT 261  --  215 180   BMET  Recent Labs  09/23/15 0006 09/24/15 1832  NA 135 139  K 3.5 3.7  CL 107 106  CO2 24 25  GLUCOSE 88 128*  BUN 12 13  CREATININE 0.84 0.92  CALCIUM 9.0 9.5   LFT  Recent Labs  09/24/15 1832  PROT 6.8  ALBUMIN 3.9  AST 59*  ALT 35  ALKPHOS 36*  BILITOT 1.0   PT/INR No results for input(s): LABPROT, INR in the last 72 hours. Hepatitis Panel No results for input(s): HEPBSAG, HCVAB, HEPAIGM, HEPBIGM in the last 72 hours.  Studies/Results: US Transvaginal Non-ob  09/23/2015  CLINICAL DATA:  Patient with anemia. History of urinary tract infection. EXAM: TRANSABDOMINAL AND TRANSVAGINAL ULTRASOUND OF PELVIS TECHNIQUE: Both transabdominal and transvaginal ultrasound examinations of the pelvis were performed. Transabdominal technique was performed for global imaging of  the pelvis including uterus, ovaries, adnexal regions, and pelvic cul-de-sac. It was necessary to proceed with endovaginal exam following the transabdominal exam to visualize the adnexal structures. COMPARISON:  None FINDINGS: Uterus Measurements: 8.5 x 4.2 x 5.1 cm. No fibroids or other mass visualized. Endometrium Thickness: 4 mm.  No focal abnormality visualized. Right ovary Measurements: 2.7 x 1.6 x 1.6 cm. There is a 1.1 x 1.0 x 1.1 cm paraovarian cyst. Left ovary Measurements: 3.4 x 1.9 x 2.2 cm. Normal appearance/no adnexal mass. Other findings No abnormal free fluid. IMPRESSION: Unremarkable pelvic ultrasound. Electronically Signed   By: Lovey Newcomer M.D.   On: 09/23/2015 14:41   US Pelvis Complete  09/23/2015  CLINICAL DATA:  Patient with anemia. History of urinary tract infection. EXAM: TRANSABDOMINAL AND TRANSVAGINAL ULTRASOUND OF PELVIS TECHNIQUE: Both transabdominal and transvaginal ultrasound examinations of the pelvis were performed. Transabdominal technique was performed for global imaging of the pelvis including uterus, ovaries, adnexal regions, and pelvic cul-de-sac. It was necessary to proceed with endovaginal exam following the transabdominal exam to visualize the adnexal structures. COMPARISON:  None FINDINGS: Uterus Measurements: 8.5 x 4.2 x 5.1 cm. No fibroids or other mass visualized. Endometrium Thickness: 4 mm.  No focal abnormality visualized. Right ovary Measurements: 2.7 x 1.6 x 1.6 cm. There is  a 1.1 x 1.0 x 1.1 cm paraovarian cyst. Left ovary Measurements: 3.4 x 1.9 x 2.2 cm. Normal appearance/no adnexal mass. Other findings No abnormal free fluid. IMPRESSION: Unremarkable pelvic ultrasound. Electronically Signed   By: Lovey Newcomer M.D.   On: 09/23/2015 14:41    ASSESMENT:   *   Severe anemia and FOBT + stool.  S/p PRBC x 2.   IDA dates back several years, sporadic treatment with OTC iron over the years and using regularly for ~ 1 year.  Teardrops and schistocytes.  Recent daily  use of 2 to 4 Aleve daily.   Hgb 7.9 to 8.6 to 7.3.  BUN normal.  Empiric BID, IV Protonix in place.   Note she is actively menstruating, so FOBT + not as reliable in this setting.    PLAN   *  EGD today.    *  Hematology eval.  Dr E, hospitalist, is aware and will call consult.   *  Not PRBC # 3 ordered, CBC in AM.          Azucena Freed  09/25/2015, 9:58 AM Pager: [phone]   Attending physician's note   I have taken an interval history, reviewed the chart and examined the patient. I agree with the Advanced Practitioner's note, impression and recommendations.  Anemia likely multifactorial. Menstrual blood loss, and also has evidence of hemolysis and bone marrow response doesn't seem to be robust. She has ferritin level >300. Seem unlikely to be related to GI blood loss. Await hematology consult. Will plan to proceed with EGD. The risks and benefits as well as alternatives of endoscopic procedure(s) have been discussed and reviewed. All questions answered. The patient agrees to proceed.  Damaris Hippo, MD [phone] Mon-Fri 8a-5p [phone] after 5p, weekends, holidays

## 2015-09-25 NOTE — Anesthesia Preprocedure Evaluation (Signed)
Anesthesia Evaluation  Patient identified by MRN, date of birth, ID band Patient awake    Reviewed: Allergy & Precautions, NPO status , Patient's Chart, lab work & pertinent test results  Airway Mallampati: II  TM Distance: >3 FB Neck ROM: Full    Dental no notable dental hx.    Pulmonary shortness of breath, former smoker,    Pulmonary exam normal breath sounds clear to auscultation       Cardiovascular negative cardio ROS Normal cardiovascular exam Rhythm:Regular Rate:Normal     Neuro/Psych  Headaches, negative psych ROS   GI/Hepatic negative GI ROS, Neg liver ROS,   Endo/Other  negative endocrine ROS  Renal/GU negative Renal ROS     Musculoskeletal negative musculoskeletal ROS (+)   Abdominal   Peds  Hematology  (+) anemia ,   Anesthesia Other Findings   Reproductive/Obstetrics negative OB ROS                             Anesthesia Physical Anesthesia Plan  ASA: II  Anesthesia Plan: MAC   Post-op Pain Management:    Induction: Intravenous  Airway Management Planned:   Additional Equipment:   Intra-op Plan:   Post-operative Plan:   Informed Consent: I have reviewed the patients History and Physical, chart, labs and discussed the procedure including the risks, benefits and alternatives for the proposed anesthesia with the patient or authorized representative who has indicated his/her understanding and acceptance.   Dental advisory given  Plan Discussed with: CRNA  Anesthesia Plan Comments:         Anesthesia Quick Evaluation

## 2015-09-25 NOTE — Anesthesia Postprocedure Evaluation (Signed)
Anesthesia Post Note  Patient: Denise Nunez  Procedure(s) Performed: Procedure(s) (LRB): ESOPHAGOGASTRODUODENOSCOPY (EGD) WITH PROPOFOL (N/A)  Patient location during evaluation: PACU Anesthesia Type: MAC Level of consciousness: awake and alert Pain management: pain level controlled Vital Signs Assessment: post-procedure vital signs reviewed and stable Respiratory status: spontaneous breathing Cardiovascular status: stable Anesthetic complications: no    Last Vitals:  Filed Vitals:   09/25/15 1449 09/25/15 1515  BP: 111/61 95/53  Pulse: 70 63  Temp: 36.9 C 36.9 C  Resp: 18 18    Last Pain:  Filed Vitals:   09/25/15 1518  PainSc: 0-No pain                 Nolon Nations

## 2015-09-26 ENCOUNTER — Other Ambulatory Visit: Payer: Self-pay | Admitting: Hematology and Oncology

## 2015-09-26 ENCOUNTER — Encounter: Payer: Self-pay | Admitting: Hematology and Oncology

## 2015-09-26 DIAGNOSIS — D519 Vitamin B12 deficiency anemia, unspecified: Secondary | ICD-10-CM

## 2015-09-26 HISTORY — DX: Vitamin B12 deficiency anemia, unspecified: D51.9

## 2015-09-26 LAB — TYPE AND SCREEN
ABO/RH(D): B POS
Antibody Screen: NEGATIVE
UNIT DIVISION: 0
UNIT DIVISION: 0
Unit division: 0

## 2015-09-26 LAB — CBC
HCT: 25.3 % — ABNORMAL LOW (ref 36.0–46.0)
HEMOGLOBIN: 8.6 g/dL — AB (ref 12.0–15.0)
MCH: 28.2 pg (ref 26.0–34.0)
MCHC: 34 g/dL (ref 30.0–36.0)
MCV: 83 fL (ref 78.0–100.0)
PLATELETS: 143 10*3/uL — AB (ref 150–400)
RBC: 3.05 MIL/uL — AB (ref 3.87–5.11)
RDW: 20.8 % — ABNORMAL HIGH (ref 11.5–15.5)
WBC: 4.5 10*3/uL (ref 4.0–10.5)

## 2015-09-26 LAB — BASIC METABOLIC PANEL
Anion gap: 7 (ref 5–15)
BUN: 11 mg/dL (ref 6–20)
CHLORIDE: 107 mmol/L (ref 101–111)
CO2: 23 mmol/L (ref 22–32)
CREATININE: 0.82 mg/dL (ref 0.44–1.00)
Calcium: 9 mg/dL (ref 8.9–10.3)
GFR calc Af Amer: >60 mL/min (ref >60)
GFR calc non Af Amer: >60 mL/min (ref >60)
GLUCOSE: 80 mg/dL (ref 65–99)
POTASSIUM: 3.8 mmol/L (ref 3.5–5.1)
Sodium: 137 mmol/L (ref 135–145)

## 2015-09-26 LAB — INTRINSIC FACTOR ANTIBODIES: INTRINSIC FACTOR: 75.7 [AU]/ml — AB (ref 0.0–1.1)

## 2015-09-26 LAB — GLUCOSE, CAPILLARY
Glucose-Capillary: 105 mg/dL — ABNORMAL HIGH (ref 65–99)
Glucose-Capillary: 84 mg/dL (ref 65–99)

## 2015-09-26 LAB — ANTI-PARIETAL ANTIBODY: Parietal Cell Antibody-IgG: 50.3 Units — ABNORMAL HIGH (ref 0.0–20.0)

## 2015-09-26 NOTE — Progress Notes (Signed)
Denise Nunez to be D/C'd Home per MD order.  Discussed prescriptions and follow up appointments with the patient. Prescriptions given to patient, medication list explained in detail. Pt verbalized understanding.    Medication List    Notice    You have not been prescribed any medications.      Filed Vitals:   09/26/15 0504 09/26/15 0806  BP: 107/58 101/57  Pulse: 60 58  Temp: 98.5 F (36.9 C) 98.4 F (36.9 C)  Resp: 18 16    Skin clean, dry and intact without evidence of skin break down, no evidence of skin tears noted. IV catheter discontinued intact. Site without signs and symptoms of complications. Dressing and pressure applied. Pt denies pain at this time. No complaints noted. Patient filled out disclosure document to send medical information to her PHP. patient received letters for school and work.  An After Visit Summary was printed and given to the patient. Patient ambulated off unit, and D/C home via private auto.  Retta Mac BSN, RN

## 2015-09-26 NOTE — Discharge Instructions (Signed)
Follow with Primary MD Glory Buff, MD in 7 days   Get CBC, CMP, checked  by Primary MD next visit.    Disposition Home    Diet: Regular diet   , On your next visit with your primary care physician please Get Medicines reviewed and adjusted.   Please request your Prim.MD to go over all Hospital Tests and Procedure/Radiological results at the follow up, please get all Hospital records sent to your Prim MD by signing hospital release before you go home.   If you experience worsening of your admission symptoms, develop shortness of breath, life threatening emergency, suicidal or homicidal thoughts you must seek medical attention immediately by calling 911 or calling your MD immediately  if symptoms less severe.  You Must read complete instructions/literature along with all the possible adverse reactions/side effects for all the Medicines you take and that have been prescribed to you. Take any new Medicines after you have completely understood and accpet all the possible adverse reactions/side effects.   Do not drive, operating heavy machinery, perform activities at heights, swimming or participation in water activities or provide baby sitting services if your were admitted for syncope or siezures until you have seen by Primary MD or a Neurologist and advised to do so again.  Do not drive when taking Pain medications.    Do not take more than prescribed Pain, Sleep and Anxiety Medications  Special Instructions: If you have smoked or chewed Tobacco  in the last 2 yrs please stop smoking, stop any regular Alcohol  and or any Recreational drug use.  Wear Seat belts while driving.   Please note  You were cared for by a hospitalist during your hospital stay. If you have any questions about your discharge medications or the care you received while you were in the hospital after you are discharged, you can call the unit and asked to speak with the hospitalist on call if the hospitalist that  took care of you is not available. Once you are discharged, your primary care physician will handle any further medical issues. Please note that NO REFILLS for any discharge medications will be authorized once you are discharged, as it is imperative that you return to your primary care physician (or establish a relationship with a primary care physician if you do not have one) for your aftercare needs so that they can reassess your need for medications and monitor your lab values.

## 2015-09-26 NOTE — Discharge Summary (Signed)
Denise Nunez, is a 35 y.o. female  DOB 13-Oct-1980  MRN [id number].  Admission date:  09/22/2015  Admitting Physician  Briscoe Deutscher, MD  Discharge Date:  09/26/2015   Primary MD  Alm Bustard, MD  Recommendations for primary care physician for things to follow:  - patient to follow with hematology Dr. Bertis Ruddy that she has an outpatient guarding. B-12 deficiency. 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 UTI (urinary tract infection)     History reviewed. No pertinent past surgical history.     History of present illness and  Hospital Course:     Kindly see H&P for history of present illness and admission details, please review complete Labs, Consult reports and Test reports for all details in brief  HPI  from the history and physical done on the day of admission 09/22/2015   HPI: Denise Nunez is a 35 y.o. female with medical history significant for occasional headaches and indigestion, managed with Tylenol and Pepcid, respectively, who presents in transfer from [facility] with fatigue, malaise, lightheadedness, nausea, early satiety, and indigestion for the past 2 weeks. Patient reports that she usually enjoys good health and takes no medications regularly other than iron supplement. She had blood work  performed approximately 18 months ago and was told that she had a mild anemia at that time and was advised to use iron supplement. She has regular periods which last approximately 5-7 days and is currently menstruating. She reports that they are not particularly heavy. Her symptoms began insidiously approximately 2 weeks ago and have progressively worsened. She denies any fevers, chills, chest pain, or abdominal pain. She has nausea, but no vomiting or diarrhea. She reports that her stools have been dark, but she had attributed this to iron supplementation. She denies any hematochezia. Denies use of NSAIDs or alcohol.   ED Course: Upon arrival to the ED, patient is found to be afebrile, saturating well on room air, and with vital signs stable. CBC revealed a hemoglobin of 5.5 with a normal MCV. Teardrop cells are noted on the peripheral smear. Chemistry panel is unremarkable. Transfer to [facility] for admission and transfusion was accepted. Patient is interviewed and examined on the medical/surgical unit at [facility] where she remains afebrile and with stable vitals. She will be transfused 2 units of packed red blood cells and started on Protonix as there is concern that  her GI symptoms may represent PUD.  Hospital Course   35 year old female without significant past medical history presents with dizziness, workup significant for severe anemia with hemoglobin of 5.1, workup significant for Hemoccult positive stool, she endorses NSAID use, reports her menstrual period not heavy, workup significant for B-12 deficiency, and teardrop and schistocytes, and underwent endoscopy 5/1 with no acute pathology to explain her findings, transfused total of 3 units PRBC during hospital stay, hemoglobin remained stable, hemoglobin is 8.6 on discharge, started on B-12 supplements, and discharged, and to follow with hematology as an outpatient.  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 - Patient endorses NSAID use, FOBT positive (patient is having her menstrual period, unclear how accurate it is also FOBT) underwent endoscopy 09/25/2015, with normal esophagus and stomach, duodenum was biopsied.  - Significant B-12 deficiency, discussed with Dr. Bertis Ruddy , will follow as an outpatient next week,  started on B-12 supplement, seated 2 doses during hospital stay ,  to continue supplements as an outpatient once followed by hematology ,antiparietal cells antibody, intrinsic antibody sent. Discharge, will need to follow-up with him at the origin as an outpatient for possible pernicious - Ferritin and iron level within normal range - Received a total of 3 units PRBC, hemoglobin remained at 8.6 today.  Discharge Condition:  Stable   Follow UP  Follow-up Information    Follow up with Alm Bustard, MD.   Specialty:  Specialist   Contact information:   [address] [place] [address] [phone]       Follow up with [facility], NI, MD.   Specialty:  Hematology and Oncology   Why:  You will be called with an appointment   Contact information:   [address] [place] [address] [phone]         Discharge Instructions  and  Discharge Medications     Discharge Instructions    Discharge instructions    Complete by:  As directed   Follow with Primary MD DORN,HENRY H, MD in 7 days   Get CBC, CMP, checked  by Primary MD next visit.    Disposition Home    Diet: Regular diet   , On your next visit with your primary care physician please Get Medicines reviewed and adjusted.   Please request your Prim.MD to go over all Hospital Tests and Procedure/Radiological results at the follow up, please get all Hospital records sent to your Prim MD by signing hospital release before you go home.   If  you experience worsening of your admission symptoms, develop shortness of breath, life threatening emergency, suicidal or homicidal thoughts you must seek medical attention immediately by calling 911 or calling your MD immediately  if symptoms less severe.  You Must read complete instructions/literature along with all the possible adverse reactions/side effects for all the Medicines you take and that have been prescribed to you. Take any new Medicines after you have completely understood and accpet all the possible adverse reactions/side effects.   Do not drive, operating heavy machinery, perform activities at heights, swimming or participation in water activities or provide baby sitting services if your were admitted for syncope or siezures until you have seen by Primary MD or a Neurologist and advised to do so again.  Do not drive when taking Pain medications.    Do not take more  than prescribed Pain, Sleep and Anxiety Medications  Special Instructions: If you have smoked or chewed Tobacco  in the last 2 yrs please stop smoking, stop any regular Alcohol  and or any Recreational drug use.  Wear Seat belts while driving.   Please note  You were cared for by a hospitalist during your hospital stay. If you have any questions about your discharge medications or the care you received while you were in the hospital after you are discharged, you can call the unit and asked to speak with the hospitalist on call if the hospitalist that took care of you is not available. Once you are discharged, your primary care physician will handle any further medical issues. Please note that NO REFILLS for any discharge medications will be authorized once you are discharged, as it is imperative that you return to your primary care physician (or establish a relationship with a primary care physician if you do not have one) for your aftercare needs so that they can reassess your need for medications and monitor your lab  values.            Medication List    Notice    You have not been prescribed any medications.        Diet and Activity recommendation: See Discharge Instructions above   Consults obtained - Discussed with Dr. Bertis Ruddy via phone   Major procedures and Radiology Reports - PLEASE review detailed and final reports for all details, in brief -   3 units PRBC transfusion   US Transvaginal Non-ob  09/23/2015  CLINICAL DATA:  Patient with anemia. History of urinary tract infection. EXAM: TRANSABDOMINAL AND TRANSVAGINAL ULTRASOUND OF PELVIS TECHNIQUE: Both transabdominal and transvaginal ultrasound examinations of the pelvis were performed. Transabdominal technique was performed for global imaging of the pelvis including uterus, ovaries, adnexal regions, and pelvic cul-de-sac. It was necessary to proceed with endovaginal exam following the transabdominal exam to visualize the adnexal structures. COMPARISON:  None FINDINGS: Uterus Measurements: 8.5 x 4.2 x 5.1 cm. No fibroids or other mass visualized. Endometrium Thickness: 4 mm.  No focal abnormality visualized. Right ovary Measurements: 2.7 x 1.6 x 1.6 cm. There is a 1.1 x 1.0 x 1.1 cm paraovarian cyst. Left ovary Measurements: 3.4 x 1.9 x 2.2 cm. Normal appearance/no adnexal mass. Other findings No abnormal free fluid. IMPRESSION: Unremarkable pelvic ultrasound. Electronically Signed   By: Annia Belt M.D.   On: 09/23/2015 14:41   US Pelvis Complete  09/23/2015  CLINICAL DATA:  Patient with anemia. History of urinary tract infection. EXAM: TRANSABDOMINAL AND TRANSVAGINAL ULTRASOUND OF PELVIS TECHNIQUE: Both transabdominal and transvaginal ultrasound examinations of the pelvis were performed. Transabdominal technique was performed for global imaging of the pelvis including uterus, ovaries, adnexal regions, and pelvic cul-de-sac. It was necessary to proceed with endovaginal exam following the transabdominal exam to visualize the adnexal  structures. COMPARISON:  None FINDINGS: Uterus Measurements: 8.5 x 4.2 x 5.1 cm. No fibroids or other mass visualized. Endometrium Thickness: 4 mm.  No focal abnormality visualized. Right ovary Measurements: 2.7 x 1.6 x 1.6 cm. There is a 1.1 x 1.0 x 1.1 cm paraovarian cyst. Left ovary Measurements: 3.4 x 1.9 x 2.2 cm. Normal appearance/no adnexal mass. Other findings No abnormal free fluid. IMPRESSION: Unremarkable pelvic ultrasound. Electronically Signed   By: Annia Belt M.D.   On: 09/23/2015 14:41    Micro Results     No results found for this or any previous visit (from the past 240  hour(s)).     Today   Subjective:   Denise Nunez today has no headache,no chest abdominal pain,no new weakness tingling or numbness, feels much better wants to go home today.   Objective:   Blood pressure 101/57, pulse 58, temperature 98.4 F (36.9 C), temperature source Oral, resp. rate 16, height 5\' 5"  (1.651 m), weight 69.264 kg (152 lb 11.2 oz), last menstrual period 09/22/2015, SpO2 100 %.   Intake/Output Summary (Last 24 hours) at 09/26/15 1044 Last data filed at 09/26/15 0600  Gross per 24 hour  Intake  412.5 ml  Output      0 ml  Net  412.5 ml    Exam Awake Alert, Oriented x 3, No new F.N deficits, Normal affect [place].AT,PERRAL Supple Neck,No JVD, No cervical lymphadenopathy appriciated.  Symmetrical Chest wall movement, Good air movement bilaterally, CTAB RRR,No Gallops,Rubs or new Murmurs, No Parasternal Heave +ve B.Sounds, Abd Soft, Non tender, No organomegaly appriciated, No rebound -guarding or rigidity. No Cyanosis, Clubbing or edema, No new Rash or bruise  Data Review   CBC w Diff: Lab Results  Component Value Date   WBC 4.5 09/26/2015   HGB 8.6* 09/26/2015   HCT 25.3* 09/26/2015   PLT 143* 09/26/2015   LYMPHOPCT 59 09/23/2015   MONOPCT 4 09/23/2015   EOSPCT 3 09/23/2015   BASOPCT 0 09/23/2015    CMP: Lab Results  Component Value Date   NA 137 09/26/2015   K 3.8  09/26/2015   CL 107 09/26/2015   CO2 23 09/26/2015   BUN 11 09/26/2015   CREATININE 0.82 09/26/2015   PROT 6.8 09/24/2015   ALBUMIN 3.9 09/24/2015   BILITOT 1.0 09/24/2015   ALKPHOS 36* 09/24/2015   AST 59* 09/24/2015   ALT 35 09/24/2015  .   Total Time in preparing paper work, data evaluation and todays exam - 35 minutes  Derrian Poli M.D on 09/26/2015 at 10:44 AM  [facility]   Office  [phone]

## 2015-09-27 LAB — HEMOGLOBINOPATHY EVALUATION
HGB A2 QUANT: 1.7 % (ref 0.7–3.1)
HGB A: 98.3 % — AB (ref 94.0–98.0)
HGB C: 0 %
HGB F QUANT: 0 % (ref 0.0–2.0)
Hgb S Quant: 0 %

## 2015-09-28 ENCOUNTER — Other Ambulatory Visit (HOSPITAL_BASED_OUTPATIENT_CLINIC_OR_DEPARTMENT_OTHER): Payer: BLUE CROSS/BLUE SHIELD

## 2015-09-28 ENCOUNTER — Ambulatory Visit (HOSPITAL_BASED_OUTPATIENT_CLINIC_OR_DEPARTMENT_OTHER): Payer: BLUE CROSS/BLUE SHIELD | Admitting: Hematology & Oncology

## 2015-09-28 ENCOUNTER — Ambulatory Visit: Payer: BLUE CROSS/BLUE SHIELD

## 2015-09-28 ENCOUNTER — Ambulatory Visit (HOSPITAL_BASED_OUTPATIENT_CLINIC_OR_DEPARTMENT_OTHER): Payer: BLUE CROSS/BLUE SHIELD

## 2015-09-28 ENCOUNTER — Encounter: Payer: Self-pay | Admitting: Hematology & Oncology

## 2015-09-28 VITALS — BP 115/67 | HR 61 | Temp 98.1°F | Resp 16 | Ht 65.0 in | Wt 149.0 lb

## 2015-09-28 DIAGNOSIS — D519 Vitamin B12 deficiency anemia, unspecified: Secondary | ICD-10-CM

## 2015-09-28 DIAGNOSIS — D510 Vitamin B12 deficiency anemia due to intrinsic factor deficiency: Secondary | ICD-10-CM

## 2015-09-28 DIAGNOSIS — R5383 Other fatigue: Secondary | ICD-10-CM

## 2015-09-28 DIAGNOSIS — D509 Iron deficiency anemia, unspecified: Secondary | ICD-10-CM

## 2015-09-28 HISTORY — DX: Vitamin B12 deficiency anemia due to intrinsic factor deficiency: D51.0

## 2015-09-28 LAB — CBC WITH DIFFERENTIAL (CANCER CENTER ONLY)
BASO#: 0 10*3/uL (ref 0.0–0.2)
BASO%: 0.2 % (ref 0.0–2.0)
EOS%: 4.7 % (ref 0.0–7.0)
Eosinophils Absolute: 0.2 10*3/uL (ref 0.0–0.5)
HCT: 28.5 % — ABNORMAL LOW (ref 34.8–46.6)
HEMOGLOBIN: 9.5 g/dL — AB (ref 11.6–15.9)
LYMPH#: 1.9 10*3/uL (ref 0.9–3.3)
LYMPH%: 41.1 % (ref 14.0–48.0)
MCH: 28.6 pg (ref 26.0–34.0)
MCHC: 33.3 g/dL (ref 32.0–36.0)
MCV: 86 fL (ref 81–101)
MONO#: 0.3 10*3/uL (ref 0.1–0.9)
MONO%: 6.4 % (ref 0.0–13.0)
NEUT%: 47.6 % (ref 39.6–80.0)
NEUTROS ABS: 2.2 10*3/uL (ref 1.5–6.5)
Platelets: 168 10*3/uL (ref 145–400)
RBC: 3.32 10*6/uL — ABNORMAL LOW (ref 3.70–5.32)
RDW: 19.9 % — ABNORMAL HIGH (ref 11.1–15.7)
WBC: 4.7 10*3/uL (ref 3.9–10.0)

## 2015-09-28 LAB — FERRITIN: Ferritin: 400 ng/ml — ABNORMAL HIGH (ref 9–269)

## 2015-09-28 LAB — IRON AND TIBC
%SAT: 10 % — ABNORMAL LOW (ref 21–57)
IRON: 23 ug/dL — AB (ref 41–142)
TIBC: 243 ug/dL (ref 236–444)
UIBC: 219 ug/dL (ref 120–384)

## 2015-09-28 LAB — LACTATE DEHYDROGENASE

## 2015-09-28 LAB — TECHNOLOGIST REVIEW CHCC SATELLITE

## 2015-09-28 LAB — CHCC SATELLITE - SMEAR

## 2015-09-28 MED ORDER — CYANOCOBALAMIN 1000 MCG/ML IJ SOLN
1000.0000 ug | Freq: Once | INTRAMUSCULAR | Status: AC
  Administered 2015-09-28: 1000 ug via INTRAMUSCULAR

## 2015-09-28 MED ORDER — CYANOCOBALAMIN 1000 MCG/ML IJ SOLN
INTRAMUSCULAR | Status: AC
  Filled 2015-09-28: qty 1

## 2015-09-28 NOTE — Patient Instructions (Signed)

## 2015-09-28 NOTE — Progress Notes (Signed)
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 Other    New Patient  : My blood is very low.  HPI: Denise Nunez is a very nice 35 year old African-American female. She really has no past medical history. She's been very healthy.  Or the past couple months, she felt more fatigued. She's had very little energy. She's had some tingling in the hands and feet.  She has not noted any obvious bleeding. She does have monthly cycles but they do not appear to be all that heavy.  She has 2 kids. She is working. She's had more difficulties with work.  She ultimately got to the point where she was very tired and weak. She had to go to the emergency room. She went on able 29th. She was found have a hemoglobin of 5.1. Her white cell count was okay. Her platelet count was okay. Her MCV was 88.  She suddenly was admitted. She's not have a vitamin B-12 level of 88. Her iron studies looked okay. She had a nonexistent reticulocyte count.  She had parietal cell antibody titer that was elevated at 50. She had an intrinsic factor antibody titer that was elevated at 51.  She was seen by gastroenterology. This was because she had a positive stool test for blood. She then underwent an upper endoscopy. Biopsies were taken. The pathology report did not show any celiac disease or inflammation.  She got 2 units of blood. She got vitamin B-12.  She had a pelvic ultrasound which was unremarkable.    She does not smoke. She really does not drink. She's had no rashes. There is no history of collagen vascular disease in the family.  There is no history of blood problems in the family.  There is no history of sickle cell disease.  She is feeling a little bit better. She was put on some oral iron. I told her to go ahead and stop the oral iron.  Overall, her performance status is ECOG  1.               Past Medical History  Diagnosis Date  . UTI (urinary tract infection)   . B12 deficiency anemia 09/26/2015  . Pernicious anemia 09/28/2015  :  Past Surgical History  Procedure Laterality Date  . Esophagogastroduodenoscopy (egd) with propofol N/A 09/25/2015    Procedure: ESOPHAGOGASTRODUODENOSCOPY (EGD) WITH PROPOFOL;  Surgeon: Mauri Pole, MD;  Location: [facility];  Service: Endoscopy;  Laterality: N/A;  :  No current outpatient prescriptions on file.:  :  Allergies  Allergen Reactions  . Bactrim [Sulfamethoxazole-Trimethoprim] Rash  :  No family history on file.:  Social History   Social History  . Marital Status: Single    Spouse Name: N/A  . Number of Children: N/A  . Years of Education: N/A   Occupational History  . Not on file.   Social History Main Topics  . Smoking status: Former Research scientist (life sciences)  . Smokeless tobacco: Not on file  . Alcohol Use: No  . Drug Use: No  . Sexual Activity: No   Other Topics Concern  . Not on file   Social History Narrative  :  Pertinent items are noted in HPI.  Exam: @IPVITALS @ Well-developed and well-nourished  African-American female in no obvious distress. Head and neck exam shows a smooth tongue. She has no obvious glossitis. There is no mucositis. She has no  adenopathy in the neck. There is no scleral icterus. Lungs are clear. Cardiac exam regular rate and rhythm with no murmurs, rubs or bruits. Abdomen is soft. She has good bowel sounds. There is no fluid wave. There is no palpable liver or spleen tip. Back exam shows no tenderness over the spine, ribs or hips. Extremity shows no clubbing, cyanosis or edema. Skin exam shows no rashes ecchymoses or petechia. Neurological exam shows no focal neurological deficits.    Recent Labs  09/26/15 0510 09/28/15 1152  WBC 4.5 4.7  HGB 8.6* 9.5*  HCT 25.3* 28.5*  PLT 143* 168    Recent Labs  09/26/15 0510  NA 137  K 3.8  CL 107  CO2 23   GLUCOSE 80  BUN 11  CREATININE 0.82  CALCIUM 9.0    Blood smear review:  not done yet.   Pathology: None     Assessment and Plan:  Denise Nunez is a 35 year old African-American female with pernicious anemia. She has both anti-parietal cell antibodies and intrinsic factor antibodies. As such, she qualifies as pernicious anemia. This is clearly an autoimmune phenomenon.  I suspect she will need lifelong B-12 replacement.  I told her how B-12 works. I told her why her body is not making B-12.  I don't think she is a bone marrow test. I don't think she needs any oral iron or IV iron.   We will go ahead and get a started on vitamin B-12. We will get her set up to get a treatment today and tomorrow and then start weekly neck suite for 4 weeks and then once a month.  I also told her to take some over-the-counter folic acid.  I spent about 45 minutes with her. She is very nice. I feel confident that we will be able to get her hemoglobin better and get her vitamin B-12 better. I told her that it may take several months for any neurological issues improve.  We will plan to see her back in one month.

## 2015-09-29 ENCOUNTER — Ambulatory Visit (HOSPITAL_BASED_OUTPATIENT_CLINIC_OR_DEPARTMENT_OTHER): Payer: BLUE CROSS/BLUE SHIELD

## 2015-09-29 VITALS — BP 112/60 | HR 67 | Temp 98.1°F | Resp 16

## 2015-09-29 DIAGNOSIS — D519 Vitamin B12 deficiency anemia, unspecified: Secondary | ICD-10-CM

## 2015-09-29 DIAGNOSIS — D510 Vitamin B12 deficiency anemia due to intrinsic factor deficiency: Secondary | ICD-10-CM | POA: Diagnosis not present

## 2015-09-29 LAB — RETICULOCYTES: Reticulocyte Count: 1.4 % (ref 0.6–2.6)

## 2015-09-29 LAB — VITAMIN B12: VITAMIN B 12: 853 pg/mL (ref 211–946)

## 2015-09-29 MED ORDER — CYANOCOBALAMIN 1000 MCG/ML IJ SOLN
INTRAMUSCULAR | Status: AC
  Filled 2015-09-29: qty 1

## 2015-09-29 MED ORDER — CYANOCOBALAMIN 1000 MCG/ML IJ SOLN
1000.0000 ug | Freq: Once | INTRAMUSCULAR | Status: AC
  Administered 2015-09-29: 1000 ug via INTRAMUSCULAR

## 2015-09-29 NOTE — Patient Instructions (Signed)

## 2015-10-05 ENCOUNTER — Ambulatory Visit (HOSPITAL_BASED_OUTPATIENT_CLINIC_OR_DEPARTMENT_OTHER): Payer: BLUE CROSS/BLUE SHIELD

## 2015-10-05 VITALS — BP 122/61 | HR 68 | Temp 98.3°F | Resp 16

## 2015-10-05 DIAGNOSIS — D510 Vitamin B12 deficiency anemia due to intrinsic factor deficiency: Secondary | ICD-10-CM | POA: Diagnosis not present

## 2015-10-05 DIAGNOSIS — D519 Vitamin B12 deficiency anemia, unspecified: Secondary | ICD-10-CM

## 2015-10-05 MED ORDER — CYANOCOBALAMIN 1000 MCG/ML IJ SOLN
1000.0000 ug | Freq: Once | INTRAMUSCULAR | Status: AC
  Administered 2015-10-05: 1000 ug via INTRAMUSCULAR

## 2015-10-05 MED ORDER — CYANOCOBALAMIN 1000 MCG/ML IJ SOLN
INTRAMUSCULAR | Status: AC
  Filled 2015-10-05: qty 1

## 2015-10-05 NOTE — Patient Instructions (Signed)

## 2015-10-05 NOTE — Progress Notes (Signed)
Patient c/o rash to upper chest, arms and face. The rash appears acne like. Patient denies itching but does say it hurts. She states this started with her first B12 injection. She denies any other lifestyle changes including meds, foods and soaps.   Spoke to the pharmacist and she looked up the side effect profile for B12. There is mention of a transient injection rash, but nothing that sounds like what the patient is experiencing. Requested that Judson Roch NP look at patient.

## 2015-10-12 ENCOUNTER — Ambulatory Visit (HOSPITAL_BASED_OUTPATIENT_CLINIC_OR_DEPARTMENT_OTHER): Payer: BLUE CROSS/BLUE SHIELD

## 2015-10-12 ENCOUNTER — Other Ambulatory Visit: Payer: Self-pay | Admitting: Family

## 2015-10-12 VITALS — BP 109/66 | HR 69 | Temp 98.1°F

## 2015-10-12 DIAGNOSIS — R21 Rash and other nonspecific skin eruption: Secondary | ICD-10-CM

## 2015-10-12 DIAGNOSIS — D510 Vitamin B12 deficiency anemia due to intrinsic factor deficiency: Secondary | ICD-10-CM | POA: Diagnosis not present

## 2015-10-12 DIAGNOSIS — D519 Vitamin B12 deficiency anemia, unspecified: Secondary | ICD-10-CM

## 2015-10-12 MED ORDER — CYANOCOBALAMIN 1000 MCG/ML IJ SOLN
INTRAMUSCULAR | Status: AC
  Filled 2015-10-12: qty 1

## 2015-10-12 MED ORDER — METHYLPREDNISOLONE 4 MG PO TBPK: ORAL_TABLET | ORAL | Status: DC

## 2015-10-12 MED ORDER — CYANOCOBALAMIN 1000 MCG/ML IJ SOLN
1000.0000 ug | Freq: Once | INTRAMUSCULAR | Status: AC
  Administered 2015-10-12: 1000 ug via INTRAMUSCULAR

## 2015-10-12 NOTE — Patient Instructions (Signed)

## 2015-10-19 ENCOUNTER — Ambulatory Visit (HOSPITAL_BASED_OUTPATIENT_CLINIC_OR_DEPARTMENT_OTHER): Payer: BLUE CROSS/BLUE SHIELD

## 2015-10-19 VITALS — BP 120/76 | HR 60 | Temp 99.0°F | Resp 18

## 2015-10-19 DIAGNOSIS — D510 Vitamin B12 deficiency anemia due to intrinsic factor deficiency: Secondary | ICD-10-CM

## 2015-10-19 DIAGNOSIS — D519 Vitamin B12 deficiency anemia, unspecified: Secondary | ICD-10-CM

## 2015-10-19 MED ORDER — CYANOCOBALAMIN 1000 MCG/ML IJ SOLN
1000.0000 ug | Freq: Once | INTRAMUSCULAR | Status: AC
  Administered 2015-10-19: 1000 ug via INTRAMUSCULAR

## 2015-10-19 MED ORDER — CYANOCOBALAMIN 1000 MCG/ML IJ SOLN
INTRAMUSCULAR | Status: AC
  Filled 2015-10-19: qty 1

## 2015-10-19 NOTE — Patient Instructions (Signed)

## 2015-10-24 ENCOUNTER — Telehealth: Payer: Self-pay | Admitting: *Deleted

## 2015-10-24 NOTE — Telephone Encounter (Signed)
Patient is having continued issues with skin rash. The rash cleared after a round of oral steroids which this office prescribed, but then after her vitamin B12 injection last Thursday, it returned. She wants to know what to do next.   Spoke with Dr Marin Olp who believes this is probably a reaction to vitamin B12 injections. He wants all injection appointments cancelled. [name] NP will follow up at previous scheduled appointment this Thursday and will possibly prescribed B12 via another route like intra nasal.  Patient understands plan and will follow up with office on Thursday.

## 2015-10-26 ENCOUNTER — Ambulatory Visit (HOSPITAL_BASED_OUTPATIENT_CLINIC_OR_DEPARTMENT_OTHER): Payer: BLUE CROSS/BLUE SHIELD | Admitting: Family

## 2015-10-26 ENCOUNTER — Encounter: Payer: Self-pay | Admitting: Family

## 2015-10-26 ENCOUNTER — Other Ambulatory Visit (HOSPITAL_BASED_OUTPATIENT_CLINIC_OR_DEPARTMENT_OTHER): Payer: BLUE CROSS/BLUE SHIELD

## 2015-10-26 ENCOUNTER — Ambulatory Visit: Payer: BLUE CROSS/BLUE SHIELD

## 2015-10-26 VITALS — BP 104/60 | HR 66 | Temp 98.1°F | Resp 16 | Ht 65.0 in | Wt 152.0 lb

## 2015-10-26 DIAGNOSIS — R21 Rash and other nonspecific skin eruption: Secondary | ICD-10-CM | POA: Diagnosis not present

## 2015-10-26 DIAGNOSIS — R5383 Other fatigue: Secondary | ICD-10-CM

## 2015-10-26 DIAGNOSIS — D509 Iron deficiency anemia, unspecified: Secondary | ICD-10-CM

## 2015-10-26 DIAGNOSIS — D510 Vitamin B12 deficiency anemia due to intrinsic factor deficiency: Secondary | ICD-10-CM

## 2015-10-26 LAB — COMPREHENSIVE METABOLIC PANEL
ALBUMIN: 4.3 g/dL (ref 3.5–5.0)
ALK PHOS: 46 U/L (ref 40–150)
ALT: 11 U/L (ref 0–55)
AST: 14 U/L (ref 5–34)
Anion Gap: 7 mEq/L (ref 3–11)
BILIRUBIN TOTAL: 0.69 mg/dL (ref 0.20–1.20)
BUN: 19.4 mg/dL (ref 7.0–26.0)
CO2: 26 mEq/L (ref 22–29)
Calcium: 9.5 mg/dL (ref 8.4–10.4)
Chloride: 106 mEq/L (ref 98–109)
Creatinine: 0.9 mg/dL (ref 0.6–1.1)
EGFR: >90 mL/min/{1.73_m2} (ref >90)
GLUCOSE: 81 mg/dL (ref 70–140)
Potassium: 4 mEq/L (ref 3.5–5.1)
SODIUM: 139 meq/L (ref 136–145)
TOTAL PROTEIN: 7.4 g/dL (ref 6.4–8.3)

## 2015-10-26 LAB — CBC WITH DIFFERENTIAL (CANCER CENTER ONLY)
BASO#: 0 10*3/uL (ref 0.0–0.2)
BASO%: 0.2 % (ref 0.0–2.0)
EOS ABS: 0.1 10*3/uL (ref 0.0–0.5)
EOS%: 2.7 % (ref 0.0–7.0)
HCT: 33.2 % — ABNORMAL LOW (ref 34.8–46.6)
HGB: 10.6 g/dL — ABNORMAL LOW (ref 11.6–15.9)
LYMPH#: 1.3 10*3/uL (ref 0.9–3.3)
LYMPH%: 29.6 % (ref 14.0–48.0)
MCH: 26.3 pg (ref 26.0–34.0)
MCHC: 31.9 g/dL — AB (ref 32.0–36.0)
MCV: 82 fL (ref 81–101)
MONO#: 0.3 10*3/uL (ref 0.1–0.9)
MONO%: 5.9 % (ref 0.0–13.0)
NEUT#: 2.7 10*3/uL (ref 1.5–6.5)
NEUT%: 61.6 % (ref 39.6–80.0)
PLATELETS: 296 10*3/uL (ref 145–400)
RBC: 4.03 10*6/uL (ref 3.70–5.32)
RDW: 15.2 % (ref 11.1–15.7)
WBC: 4.4 10*3/uL (ref 3.9–10.0)

## 2015-10-26 LAB — CHCC SATELLITE - SMEAR

## 2015-10-26 LAB — IRON AND TIBC
%SAT: 34 % (ref 21–57)
Iron: 87 ug/dL (ref 41–142)
TIBC: 255 ug/dL (ref 236–444)
UIBC: 168 ug/dL (ref 120–384)

## 2015-10-26 LAB — FERRITIN: Ferritin: 194 ng/ml (ref 9–269)

## 2015-10-26 MED ORDER — CYANOCOBALAMIN 500 MCG/0.1ML NA SOLN: 500.0000 ug | NASAL | Status: DC

## 2015-10-26 MED ORDER — METHYLPREDNISOLONE 4 MG PO TBPK: ORAL_TABLET | ORAL | Status: DC

## 2015-10-26 NOTE — Progress Notes (Signed)
Hematology and Oncology Follow Up Visit  Denise Nunez [id number] [birth year] 35 y.o. 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 She still has the maculopapular rash on her arms chest and face. It itches at times and did clear up with the initial steroid dose pack. However, after receiving another B 12 injection she developed the rash again. This is likely due to an allergy to a preservative in the injection.  Her B 12 level earlier this month was up to 853. Her Hgb continues to improve and is now 10.6 with an MCV of 82.  She denies fatigue. No fever, chills, n/v, cough, dizziness, SOB, chest pain, palpitations, abdominal pain or changes in bowel or bladder habits.  No swelling, tenderness, numbness or tingling in her extremities. No c/o joint aches.  Her appetite is improving and she is staying well hydrated. Her weight is stable.   Medications:    Medication List    Notice  As of 10/26/2015  8:47 AM   You have not been prescribed any medications.      Allergies:  Allergies  Allergen Reactions  . Bactrim [Sulfamethoxazole-Trimethoprim] Rash    Past Medical History, Surgical history, Social history, and Family History were reviewed and updated.  Review of Systems: All other 10 point review of systems is negative.   Physical Exam:  height is 5\' 5"  (1.651 m) and weight is 152 lb (68.947 kg). Her oral temperature is 98.1 F (36.7 C). Her blood pressure is 104/60 and her pulse is 66. Her respiration is 16.   Wt Readings from Last 3 Encounters:  10/26/15 152 lb (68.947 kg)  09/28/15 149 lb (67.586 kg)  09/25/15 152 lb 11.2 oz (69.264 kg)    Ocular: Sclerae unicteric, pupils equal, round and reactive to light Ear-nose-throat: Oropharynx clear, dentition fair Lymphatic: No cervical  supraclavicular or axillary adenopathy Lungs no rales or rhonchi, good excursion bilaterally Heart regular rate and rhythm, no murmur appreciated Abd soft, nontender, positive bowel sounds, no liver or spleen tip palpated on exam, no fluid wave MSK no focal spinal tenderness, no joint edema Neuro: non-focal, well-oriented, appropriate affect Breasts: Deferred    Lab Results  Component Value Date   WBC 4.4 10/26/2015   HGB 10.6* 10/26/2015   HCT 33.2* 10/26/2015   MCV 82 10/26/2015   PLT 296 10/26/2015   Lab Results  Component Value Date   FERRITIN 400* 09/28/2015   IRON 23* 09/28/2015   TIBC 243 09/28/2015   UIBC 219 09/28/2015   IRONPCTSAT 10* 09/28/2015   Lab Results  Component Value Date   RETICCTPCT 0.5 09/24/2015   RBC 4.03 10/26/2015   No results found for: KPAFRELGTCHN, LAMBDASER, KAPLAMBRATIO No results found for: IGGSERUM, IGA, IGMSERUM No results found for: Ronnald Ramp, A1GS, A2GS, Tillman Sers, SPEI   Chemistry      Component Value Date/Time   NA 137 09/26/2015 0510   K 3.8 09/26/2015 0510   CL 107 09/26/2015 0510   CO2 23 09/26/2015 0510   BUN 11 09/26/2015 0510   CREATININE 0.82 09/26/2015 0510      Component Value Date/Time   CALCIUM 9.0 09/26/2015 0510   ALKPHOS 36* 09/24/2015 1832   AST 59* 09/24/2015 1832   ALT 35 09/24/2015 1832   BILITOT 1.0 09/24/2015 1832  Impression and Plan: Denise Nunez is a very pleasant 35 yo African-American female with 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 Her B 12 level has responded nicely to the injections however she keeps developing a rash afterwards. She is otherwise asymptomatic at this time.  We will have her try the intranasal form of B 12, Nascobal. Hopefully this will be more tolerable and not cause the rash. A medrol dose pack was also prescribed for the rash.  We will plan to see her back in 2 months for repeat labs and  follow-up.  She will contact us with any questions or concerns. We can certainly see her sooner if need be.   Denise Bottom, NP 6/1/20178:47 AM

## 2015-10-27 ENCOUNTER — Telehealth: Payer: Self-pay | Admitting: Nurse Practitioner

## 2015-10-27 LAB — VITAMIN B12: VITAMIN B 12: 677 pg/mL (ref 211–946)

## 2015-10-27 LAB — INTRINSIC FACTOR ANTIBODIES: Intrinsic Factor Abs, Serum: 69.3 AU/mL — ABNORMAL HIGH (ref 0.0–1.1)

## 2015-10-27 LAB — ANTI-PARIETAL ANTIBODY: PARIETAL CELL AB: 50.9 U — AB (ref 0.0–20.0)

## 2015-10-27 LAB — RETICULOCYTES: Reticulocyte Count: 1.3 % (ref 0.6–2.6)

## 2015-10-27 NOTE — Telephone Encounter (Addendum)
Pt verbalized understanding and appreciation  ----- Message from Eliezer Bottom, NP sent at 10/26/2015  2:54 PM EDT ----- Regarding: iron Iron studies look good. She will not need an infusion at this time. Thank you!  Sarah   ----- Message -----    From: Volanda Napoleon, MD    Sent: 10/26/2015  11:20 AM      To: Eliezer Bottom, NP   ----- Message -----    From: [facility] Interface    Sent: 10/26/2015   8:41 AM      To: Volanda Napoleon, MD

## 2015-11-23 ENCOUNTER — Ambulatory Visit: Payer: BLUE CROSS/BLUE SHIELD

## 2015-11-23 ENCOUNTER — Other Ambulatory Visit: Payer: Self-pay

## 2015-11-23 DIAGNOSIS — D510 Vitamin B12 deficiency anemia due to intrinsic factor deficiency: Secondary | ICD-10-CM

## 2015-11-24 ENCOUNTER — Other Ambulatory Visit (HOSPITAL_BASED_OUTPATIENT_CLINIC_OR_DEPARTMENT_OTHER): Payer: BLUE CROSS/BLUE SHIELD

## 2015-11-24 DIAGNOSIS — D510 Vitamin B12 deficiency anemia due to intrinsic factor deficiency: Secondary | ICD-10-CM | POA: Diagnosis not present

## 2015-11-24 LAB — CBC WITH DIFFERENTIAL (CANCER CENTER ONLY)
BASO#: 0 10*3/uL (ref 0.0–0.2)
BASO%: 0.3 % (ref 0.0–2.0)
EOS%: 4.1 % (ref 0.0–7.0)
Eosinophils Absolute: 0.1 10*3/uL (ref 0.0–0.5)
HCT: 35.3 % (ref 34.8–46.6)
HGB: 11.2 g/dL — ABNORMAL LOW (ref 11.6–15.9)
LYMPH#: 1.2 10*3/uL (ref 0.9–3.3)
LYMPH%: 36.3 % (ref 14.0–48.0)
MCH: 24.9 pg — ABNORMAL LOW (ref 26.0–34.0)
MCHC: 31.7 g/dL — AB (ref 32.0–36.0)
MCV: 78 fL — ABNORMAL LOW (ref 81–101)
MONO#: 0.2 10*3/uL (ref 0.1–0.9)
MONO%: 6.8 % (ref 0.0–13.0)
NEUT#: 1.8 10*3/uL (ref 1.5–6.5)
NEUT%: 52.5 % (ref 39.6–80.0)
PLATELETS: 286 10*3/uL (ref 145–400)
RBC: 4.5 10*6/uL (ref 3.70–5.32)
RDW: 14.6 % (ref 11.1–15.7)
WBC: 3.4 10*3/uL — ABNORMAL LOW (ref 3.9–10.0)

## 2015-11-24 LAB — CHCC SATELLITE - SMEAR

## 2015-11-25 LAB — RETICULOCYTES: RETICULOCYTE COUNT: 0.8 % (ref 0.6–2.6)

## 2015-11-29 ENCOUNTER — Other Ambulatory Visit: Payer: BLUE CROSS/BLUE SHIELD

## 2015-11-29 ENCOUNTER — Encounter: Payer: Self-pay | Admitting: Nurse Practitioner

## 2015-11-29 DIAGNOSIS — D510 Vitamin B12 deficiency anemia due to intrinsic factor deficiency: Secondary | ICD-10-CM

## 2015-11-29 DIAGNOSIS — D649 Anemia, unspecified: Secondary | ICD-10-CM

## 2015-11-29 NOTE — Progress Notes (Signed)
Patient called and stated she was at work and "feeling a little shaky and sluggish". Stated she received a letter from her insurance that she would have to receive her nasal B12 via mail-in pharmacy and she was going to bring the form. B12 levels last checked on 10/26/15, she will have a repeat of labs today when she stops by to monitor her levels. Sarah, NP is aware and patient instructed that her she should make sure she is consuming a well balanced meal as this could be causing some symptoms. Patient verbalized understanding. Work excuse for today will be given for lab appointment.

## 2015-12-01 ENCOUNTER — Other Ambulatory Visit: Payer: BLUE CROSS/BLUE SHIELD

## 2015-12-21 ENCOUNTER — Ambulatory Visit: Payer: BLUE CROSS/BLUE SHIELD

## 2015-12-27 ENCOUNTER — Other Ambulatory Visit: Payer: BLUE CROSS/BLUE SHIELD

## 2015-12-27 ENCOUNTER — Ambulatory Visit: Payer: BLUE CROSS/BLUE SHIELD | Admitting: Family

## 2016-03-14 ENCOUNTER — Encounter (HOSPITAL_BASED_OUTPATIENT_CLINIC_OR_DEPARTMENT_OTHER): Payer: Self-pay | Admitting: *Deleted

## 2016-03-14 ENCOUNTER — Emergency Department (HOSPITAL_BASED_OUTPATIENT_CLINIC_OR_DEPARTMENT_OTHER)
Admission: EM | Admit: 2016-03-14 | Discharge: 2016-03-14 | Disposition: A | Discharge status: Home / Self Care | Payer: Medicaid Other | Attending: Emergency Medicine | Admitting: Emergency Medicine

## 2016-03-14 DIAGNOSIS — B349 Viral infection, unspecified: Secondary | ICD-10-CM | POA: Diagnosis not present

## 2016-03-14 DIAGNOSIS — Z87891 Personal history of nicotine dependence: Secondary | ICD-10-CM | POA: Diagnosis not present

## 2016-03-14 DIAGNOSIS — R69 Illness, unspecified: Secondary | ICD-10-CM

## 2016-03-14 DIAGNOSIS — R509 Fever, unspecified: Secondary | ICD-10-CM | POA: Diagnosis present

## 2016-03-14 DIAGNOSIS — J111 Influenza due to unidentified influenza virus with other respiratory manifestations: Secondary | ICD-10-CM

## 2016-03-14 LAB — CBC WITH DIFFERENTIAL/PLATELET
BASOS ABS: 0 10*3/uL (ref 0.0–0.1)
BASOS PCT: 0 %
Eosinophils Absolute: 0.1 10*3/uL (ref 0.0–0.7)
Eosinophils Relative: 3 %
HEMATOCRIT: 31.8 % — AB (ref 36.0–46.0)
HEMOGLOBIN: 10.3 g/dL — AB (ref 12.0–15.0)
LYMPHS PCT: 38 %
Lymphs Abs: 1.5 10*3/uL (ref 0.7–4.0)
MCH: 23.7 pg — ABNORMAL LOW (ref 26.0–34.0)
MCHC: 32.4 g/dL (ref 30.0–36.0)
MCV: 73.3 fL — AB (ref 78.0–100.0)
MONO ABS: 0.4 10*3/uL (ref 0.1–1.0)
Monocytes Relative: 11 %
NEUTROS ABS: 1.9 10*3/uL (ref 1.7–7.7)
NEUTROS PCT: 48 %
Platelets: 255 10*3/uL (ref 150–400)
RBC: 4.34 MIL/uL (ref 3.87–5.11)
RDW: 16.5 % — AB (ref 11.5–15.5)
WBC: 3.9 10*3/uL — ABNORMAL LOW (ref 4.0–10.5)

## 2016-03-14 LAB — URINALYSIS, ROUTINE W REFLEX MICROSCOPIC
Bilirubin Urine: NEGATIVE
GLUCOSE, UA: NEGATIVE mg/dL
KETONES UR: NEGATIVE mg/dL
Nitrite: NEGATIVE
PH: 7 (ref 5.0–8.0)
Protein, ur: NEGATIVE mg/dL
SPECIFIC GRAVITY, URINE: 1.007 (ref 1.005–1.030)

## 2016-03-14 LAB — BASIC METABOLIC PANEL
ANION GAP: 6 (ref 5–15)
BUN: 12 mg/dL (ref 6–20)
CO2: 27 mmol/L (ref 22–32)
Calcium: 9.3 mg/dL (ref 8.9–10.3)
Chloride: 105 mmol/L (ref 101–111)
Creatinine, Ser: 0.78 mg/dL (ref 0.44–1.00)
GLUCOSE: 86 mg/dL (ref 65–99)
POTASSIUM: 4.3 mmol/L (ref 3.5–5.1)
Sodium: 138 mmol/L (ref 135–145)

## 2016-03-14 LAB — URINE MICROSCOPIC-ADD ON

## 2016-03-14 LAB — PREGNANCY, URINE: Preg Test, Ur: NEGATIVE

## 2016-03-14 MED ORDER — PROMETHAZINE HCL 25 MG PO TABS: 25.0000 mg | ORAL_TABLET | Freq: Four times a day (QID) | ORAL | 0 refills | Status: DC | PRN

## 2016-03-14 MED ORDER — SODIUM CHLORIDE 0.9 % IV BOLUS (SEPSIS)
1000.0000 mL | Freq: Once | INTRAVENOUS | Status: AC
  Administered 2016-03-14: 1000 mL via INTRAVENOUS

## 2016-03-14 NOTE — ED Triage Notes (Signed)
Chills and fever x 3 days. Vomiting at work today. She feels like she has the flu.

## 2016-03-14 NOTE — ED Provider Notes (Signed)
[facility] Provider Note   CSN: [id number] Arrival date & time: 03/14/16  1321     History   Chief Complaint Chief Complaint  Patient presents with  . Fever    HPI Denise Nunez is a 35 y.o. female.  HPI Patient had a 2 to three-day history of increased fever and chills at home.  I'm sure his eyes 103.  Felt better today but at work and vomited.  Denies diarrhea.  Denies exposure to influenza but states she aches all over.  Denies back pain.  Has had some increased frequency.  Denies cough or productive sputum. Past Medical History:  Diagnosis Date  . B12 deficiency anemia 09/26/2015  . Pernicious anemia 09/28/2015  . UTI (urinary tract infection)     Patient Active Problem List   Diagnosis Date Noted  . Pernicious anemia 09/28/2015  . B12 deficiency anemia 09/26/2015  . Absolute anemia   . Protein-calorie malnutrition, severe 09/24/2015  . Symptomatic anemia 09/23/2015  . Headache 09/23/2015  . Nausea without vomiting 09/23/2015  . Early satiety 09/23/2015  . Indigestion 09/23/2015  . Other fatigue   . Shortness of breath     Past Surgical History:  Procedure Laterality Date  . ESOPHAGOGASTRODUODENOSCOPY (EGD) WITH PROPOFOL N/A 09/25/2015   Procedure: ESOPHAGOGASTRODUODENOSCOPY (EGD) WITH PROPOFOL;  Surgeon: Mauri Pole, MD;  Location: [facility];  Service: Endoscopy;  Laterality: N/A;    OB History    No data available       Home Medications    Prior to Admission medications   Medication Sig Start Date End Date Taking? Authorizing Provider  Cyanocobalamin (NASCOBAL) 500 MCG/0.1ML SOLN Place 0.1 mLs (500 mcg total) into the nose once a week. 10/26/15   Eliezer Bottom, NP  methylPREDNISolone (MEDROL DOSEPAK) 4 MG TBPK tablet Take as directed on package. 10/26/15   Eliezer Bottom, NP  promethazine (PHENERGAN) 25 MG tablet Take 1 tablet (25 mg total) by mouth every 6 (six) hours as needed for nausea or vomiting. 03/14/16   Leonard Schwartz, MD      Family History No family history on file.  Social History Social History  Substance Use Topics  . Smoking status: Former Research scientist (life sciences)  . Smokeless tobacco: Never Used  . Alcohol use No     Allergies   Bactrim [sulfamethoxazole-trimethoprim]   Review of Systems Review of Systems  All other systems reviewed and are negative Physical Exam Updated Vital Signs BP 118/72   Pulse (!) 51   Temp 98.3 F (36.8 C) (Oral)   Resp 18   Ht 5\' 5"  (1.651 m)   Wt 152 lb (68.9 kg)   LMP 03/10/2016   SpO2 100%   BMI 25.29 kg/m   Physical Exam Physical Exam  Nursing note and vitals reviewed. Constitutional: She is oriented to person, place, and time. She appears well-developed and well-nourished. No distress.  HENT:  Head: Normocephalic and atraumatic.  Eyes: Pupils are equal, round, and reactive to light.  Neck: Normal range of motion.  Cardiovascular: Normal rate and intact distal pulses.  No significant murmurs.   Pulmonary/Chest: No respiratory distress.  Clear to auscultation.   Abdominal: Normal appearance. She exhibits no distension.  No tenderness to palpation.  Bowel sounds active.   Musculoskeletal: Normal range of motion.  Neurological: She is alert and oriented to person, place, and time. No cranial nerve deficit. Bone no meningeal signs.   Skin: Skin is warm and dry. No rash noted.  Psychiatric: She has a  normal mood and affect. Her behavior is normal.    ED Treatments / Results  Labs (all labs ordered are listed, but only abnormal results are displayed) Labs Reviewed  CBC WITH DIFFERENTIAL/PLATELET - Abnormal; Notable for the following:       Result Value   WBC 3.9 (*)    Hemoglobin 10.3 (*)    HCT 31.8 (*)    MCV 73.3 (*)    MCH 23.7 (*)    RDW 16.5 (*)    All other components within normal limits  URINALYSIS, ROUTINE W REFLEX MICROSCOPIC (NOT AT [facility]) - Abnormal; Notable for the following:    Hgb urine dipstick SMALL (*)    Leukocytes, UA TRACE (*)     All other components within normal limits  URINE MICROSCOPIC-ADD ON - Abnormal; Notable for the following:    Squamous Epithelial / LPF 0-5 (*)    Bacteria, UA MANY (*)    All other components within normal limits  URINE CULTURE  BASIC METABOLIC PANEL  PREGNANCY, URINE    EKG  EKG Interpretation None       Radiology No results found.  Procedures Procedures (including critical care time)  Medications Ordered in ED Medications  sodium chloride 0.9 % bolus 1,000 mL (1,000 mLs Intravenous New Bag/Given 03/14/16 1425)     Initial Impression / Assessment and Plan / ED Course  I have reviewed the triage vital signs and the nursing notes.  Pertinent labs & imaging results that were available during my care of the patient were reviewed by me and considered in my medical decision making (see chart for details).  Clinical Course      Final Clinical Impressions(s) / ED Diagnoses   Final diagnoses:  Viral illness  Influenza-like illness  Febrile illness    New Prescriptions New Prescriptions   PROMETHAZINE (PHENERGAN) 25 MG TABLET    Take 1 tablet (25 mg total) by mouth every 6 (six) hours as needed for nausea or vomiting.     Leonard Schwartz, MD 03/14/16 [phone]

## 2016-03-14 NOTE — ED Notes (Signed)
IV flushed and fluids continuing to run in  - not able to d/c until they are completed.

## 2016-03-16 ENCOUNTER — Encounter (HOSPITAL_BASED_OUTPATIENT_CLINIC_OR_DEPARTMENT_OTHER): Payer: Self-pay | Admitting: Emergency Medicine

## 2016-03-16 ENCOUNTER — Emergency Department (HOSPITAL_BASED_OUTPATIENT_CLINIC_OR_DEPARTMENT_OTHER)
Admission: EM | Admit: 2016-03-16 | Discharge: 2016-03-17 | Disposition: A | Discharge status: Home / Self Care | Payer: Medicaid Other | Attending: Emergency Medicine | Admitting: Emergency Medicine

## 2016-03-16 DIAGNOSIS — J111 Influenza due to unidentified influenza virus with other respiratory manifestations: Secondary | ICD-10-CM | POA: Diagnosis not present

## 2016-03-16 DIAGNOSIS — Z87891 Personal history of nicotine dependence: Secondary | ICD-10-CM | POA: Diagnosis not present

## 2016-03-16 DIAGNOSIS — R52 Pain, unspecified: Secondary | ICD-10-CM | POA: Diagnosis present

## 2016-03-16 DIAGNOSIS — R69 Illness, unspecified: Secondary | ICD-10-CM

## 2016-03-16 LAB — URINE CULTURE

## 2016-03-16 MED ORDER — IBUPROFEN 200 MG PO TABS
600.0000 mg | ORAL_TABLET | Freq: Once | ORAL | Status: AC
  Administered 2016-03-16: 600 mg via ORAL
  Filled 2016-03-16: qty 1

## 2016-03-16 NOTE — ED Triage Notes (Signed)
Pt in c/o still feeling bad with flu-like sx after being seen for same x 2 days ago. Pt is alert, interactive, ambulatory in NAD.

## 2016-03-17 ENCOUNTER — Telehealth (HOSPITAL_BASED_OUTPATIENT_CLINIC_OR_DEPARTMENT_OTHER): Payer: Self-pay

## 2016-03-17 ENCOUNTER — Emergency Department (HOSPITAL_BASED_OUTPATIENT_CLINIC_OR_DEPARTMENT_OTHER): Payer: Medicaid Other

## 2016-03-17 NOTE — Telephone Encounter (Signed)
Post ED Visit - Positive Culture Follow-up  Culture report reviewed by antimicrobial stewardship pharmacist:  []  Elenor Quinones, Pharm.D. []  Heide Guile, Pharm.D., BCPS []  Parks Neptune, Pharm.D. []  Alycia Rossetti, Pharm.D., BCPS [x]  [place], Pharm.D., BCPS, AAHIVP []  Legrand Como, Pharm.D., BCPS, AAHIVP []  Milus Glazier, Pharm.D. []  Stephens November, Pharm.D.  Positive urine culture  and no further patient follow-up is required at this time.  Genia Del 03/17/2016, 11:42 AM

## 2016-03-17 NOTE — Discharge Instructions (Signed)
You were seen in the ED today with muscle aches and flu-like symptoms. This is most likely from a viral illness that will eventually improve. There is no medication we can give to stop this infection. You had no evidence of pneumonia on chest x-ray. Follow up with your PCP in the coming days. Take Tylenol and/or Motrin as needed for muscle aches. Continue to monitor symptoms closely and return with any sudden worsening difficulty breathing, chest pain, fainting, or other concerning symptoms for you.

## 2016-03-17 NOTE — Progress Notes (Signed)
ED Antimicrobial Stewardship Positive Culture Follow Up   Denise Nunez is an 35 y.o. female who presented to [facility] on 03/16/2016 with a chief complaint of  Chief Complaint  Patient presents with  . Generalized Body Aches    Recent Results (from the past 720 hour(s))  Urine culture     Status: Abnormal   Collection Time: 03/14/16  2:20 PM  Result Value Ref Range Status   Specimen Description URINE, RANDOM  Final   Special Requests NONE  Final   Culture >=100,000 COLONIES/mL ESCHERICHIA COLI (A)  Final   Report Status 03/16/2016 FINAL  Final   Organism ID, Bacteria ESCHERICHIA COLI (A)  Final      Susceptibility   Escherichia coli - MIC*    AMPICILLIN 4 SENSITIVE Sensitive     CEFAZOLIN <=4 SENSITIVE Sensitive     CEFTRIAXONE <=1 SENSITIVE Sensitive     CIPROFLOXACIN <=0.25 SENSITIVE Sensitive     GENTAMICIN <=1 SENSITIVE Sensitive     IMIPENEM <=0.25 SENSITIVE Sensitive     NITROFURANTOIN <=16 SENSITIVE Sensitive     TRIMETH/SULFA <=20 SENSITIVE Sensitive     AMPICILLIN/SULBACTAM <=2 SENSITIVE Sensitive     PIP/TAZO <=4 SENSITIVE Sensitive     Extended ESBL NEGATIVE Sensitive     * >=100,000 COLONIES/mL ESCHERICHIA COLI    No urinary symptoms and UA was neg. No treatment.   ED Provider: Cathi Roan, [place]   Onnie Boer, PharmD Pager: [phone] Infectious Diseases Pharmacist Phone# [phone]

## 2016-03-17 NOTE — ED Provider Notes (Signed)
Emergency Department Provider Note By signing my name below, I, Neta Mends, attest that this documentation has been prepared under the direction and in the presence of Margette Fast, MD . Electronically Signed: Neta Mends, ED Scribe. 03/17/2016. 12:27 AM.   I have reviewed the triage vital signs and the nursing notes.   HISTORY  Chief Complaint Generalized Body Aches   HPI  Denise Nunez is a 35 y.o. female who presents to the Emergency Department complaining of constant generalized body aches x 5 days. Pt states that the pain is primarily in her knees, hips, ankles, and wrists. Pt complains of associated fever, chest pain when breathing, vomiting, and diarrhea. Pt was seen at the ED for flu-like symptoms 2 days ago, and symptoms have not improved. Pt states that the Motrin she was given in triage has provided significant relief. Pt denies abdominal pain, cough, joint swelling. No gait difficulty.   Past Medical History:  Diagnosis Date  . B12 deficiency anemia 09/26/2015  . Pernicious anemia 09/28/2015  . UTI (urinary tract infection)     Patient Active Problem List   Diagnosis Date Noted  . Pernicious anemia 09/28/2015  . B12 deficiency anemia 09/26/2015  . Absolute anemia   . Protein-calorie malnutrition, severe 09/24/2015  . Symptomatic anemia 09/23/2015  . Headache 09/23/2015  . Nausea without vomiting 09/23/2015  . Early satiety 09/23/2015  . Indigestion 09/23/2015  . Other fatigue   . Shortness of breath     Past Surgical History:  Procedure Laterality Date  . ESOPHAGOGASTRODUODENOSCOPY (EGD) WITH PROPOFOL N/A 09/25/2015   Procedure: ESOPHAGOGASTRODUODENOSCOPY (EGD) WITH PROPOFOL;  Surgeon: Mauri Pole, MD;  Location: [facility];  Service: Endoscopy;  Laterality: N/A;    Current Outpatient Rx  . Order #: [id number] Class: Normal  . Order #: [id number] Class: Normal  . Order #: [id number] Class: Print    Allergies Bactrim  [sulfamethoxazole-trimethoprim]  History reviewed. No pertinent family history.  Social History Social History  Substance Use Topics  . Smoking status: Former Research scientist (life sciences)  . Smokeless tobacco: Never Used  . Alcohol use No    Review of Systems  Constitutional: No fever/chills Eyes: No visual changes. ENT: No sore throat. Positive runny nose.  Cardiovascular: Denies chest pain. Respiratory: Denies shortness of breath. Positive cough.  Gastrointestinal: No abdominal pain.  No nausea, no vomiting.  No diarrhea.  No constipation. Genitourinary: Negative for dysuria. Musculoskeletal: Negative for back pain. Positive bilateral joint pain.  Skin: Negative for rash. Neurological: Negative for headaches, focal weakness or numbness.  10-point ROS otherwise negative.  ____________________________________________   PHYSICAL EXAM:  VITAL SIGNS: ED Triage Vitals  Enc Vitals Group     BP 03/16/16 2147 121/71     Pulse Rate 03/16/16 2147 78     Resp 03/16/16 2147 20     Temp 03/16/16 2147 101.5 F (38.6 C)     SpO2 03/16/16 2147 100 %     Weight 03/16/16 2148 151 lb (68.5 kg)     Height 03/16/16 2148 5\' 5"  (1.651 m)     Pain Score 03/16/16 2148 7   Constitutional: Alert and oriented. Well appearing and in no acute distress. Eyes: Conjunctivae are normal.  Head: Atraumatic. Nose: No congestion/rhinnorhea. Mouth/Throat: Mucous membranes are slightly dry. Oropharynx non-erythematous. Neck: No stridor.  No meningeal signs.  Cardiovascular: Normal rate, regular rhythm. Good peripheral circulation. Grossly normal heart sounds.   Respiratory: Normal respiratory effort.  No retractions. Lungs CTAB. Gastrointestinal: Soft and nontender. No distention.  Musculoskeletal: No lower extremity tenderness nor edema. No gross deformities of extremities. Neurologic:  Normal speech and language. No gross focal neurologic deficits are appreciated.  Skin:  Skin is warm, dry and intact. No rash  noted. Psychiatric: Mood and affect are normal. Speech and behavior are normal.  ____________________________________________  DIAGNOSTIC STUDIES:  Oxygen Saturation is 100% on RA, normal by my interpretation.    COORDINATION OF CARE:  12:16 AM Discussed treatment plan with pt at bedside and pt agreed to plan.  ____________________________________________  RADIOLOGY  Dg Chest 2 View  Result Date: 03/17/2016 CLINICAL DATA:  Generalized body aches for 5 days. Seen in emergency department 2 days ago for same symptoms. EXAM: CHEST  2 VIEW COMPARISON:  Chest radiograph December 21, 2012 FINDINGS: Cardiomediastinal silhouette is normal. No pleural effusions or focal consolidations. Trachea projects midline and there is no pneumothorax. Soft tissue planes and included osseous structures are non-suspicious IMPRESSION: Normal chest . Electronically Signed   By: Elon Alas M.D.   On: 03/17/2016 01:36    ____________________________________________   PROCEDURES  Procedure(s) performed:   Procedures  None ____________________________________________   INITIAL IMPRESSION / ASSESSMENT AND PLAN / ED COURSE  Pertinent labs & imaging results that were available during my care of the patient were reviewed by me and considered in my medical decision making (see chart for details).  Patient resents the emergency department for evaluation of URI symptoms and bilateral joint pain worse in the LEs. No focal neurological deficits on exam. Patient does have a fever here in the emergency department but is overall very well-appearing and nontoxic. Abdomen is soft and nontender. Labs available for review from 10/19. No clear indication for repeat labs as this seems most consistent with viral process. CXR negative for lobar PNA. Plan for continued symptomatic mgmt at home and PCP follow up in the coming days. Considered septic joint but patient's symptoms are polyarticular with full active and passive  ROM without difficulty or associated redness or swelling.   At this time, I do not feel there is any life-threatening condition present. I have reviewed and discussed all results (EKG, imaging, lab, urine as appropriate), exam findings with patient. I have reviewed nursing notes and appropriate previous records.  I feel the patient is safe to be discharged home without further emergent workup. Discussed usual and customary return precautions. Patient and family (if present) verbalize understanding and are comfortable with this plan.  Patient will follow-up with their primary care provider. If they do not have a primary care provider, information for follow-up has been provided to them. All questions have been answered.    ____________________________________________  FINAL CLINICAL IMPRESSION(S) / ED DIAGNOSES  Final diagnoses:  Influenza-like illness     MEDICATIONS GIVEN DURING THIS VISIT:  Medications  ibuprofen (ADVIL,MOTRIN) tablet 600 mg (600 mg Oral Given 03/16/16 2153)     NEW OUTPATIENT MEDICATIONS STARTED DURING THIS VISIT:  None   Note:  This document was prepared using Dragon voice recognition software and may include unintentional dictation errors.  Nanda Quinton, MD Emergency Medicine  Documentation performed with the assistance of the scribe. I have reviewed documentation for completeness and made changes as needed.     Margette Fast, MD 03/17/16 [phone]

## 2016-05-16 IMAGING — US US TRANSVAGINAL NON-OB
1 series · 14 of 25 positions shown · non-contrast
Comparison: None

CLINICAL DATA: Patient with anemia. History of urinary tract
infection.

EXAM:
TRANSABDOMINAL AND TRANSVAGINAL ULTRASOUND OF PELVIS
TECHNIQUE: Both transabdominal and transvaginal ultrasound examinations of the
pelvis were performed. Transabdominal technique was performed for
global imaging of the pelvis including uterus, ovaries, adnexal
regions, and pelvic cul-de-sac. It was necessary to proceed with
endovaginal exam following the transabdominal exam to visualize the
adnexal structures.

[Series 1: us transvaginal non-ob · 0.20mm/px · 14 of 79 slices shown]
[im 1/79]
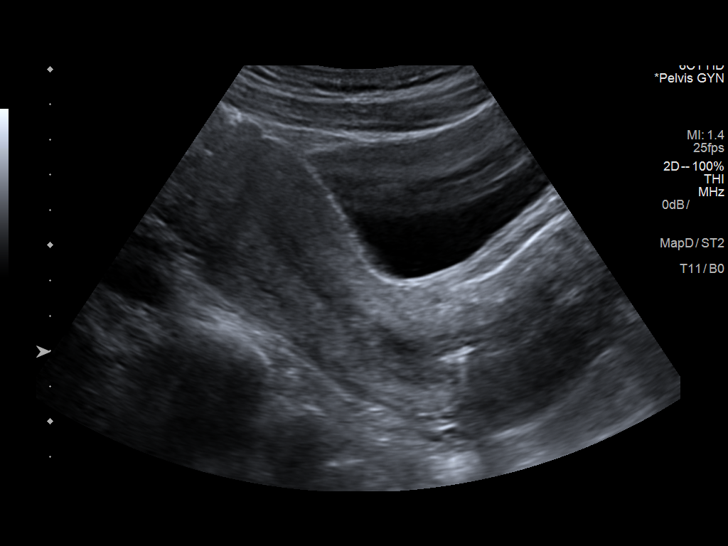
[im 7/79]
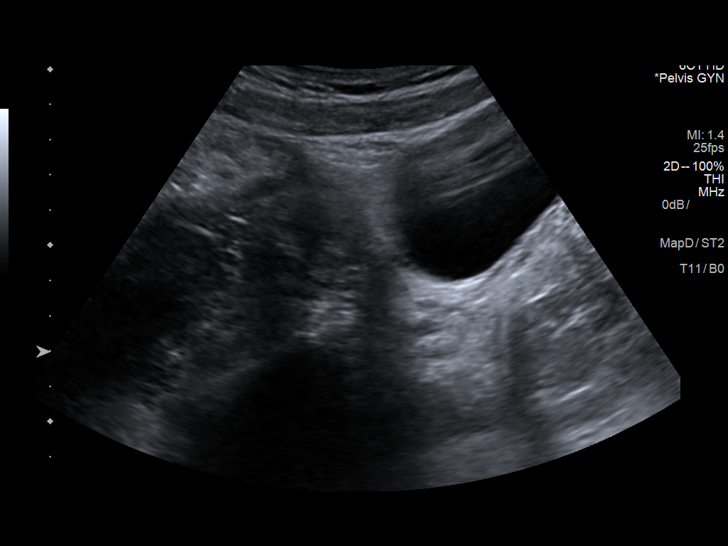
[im 14/79]
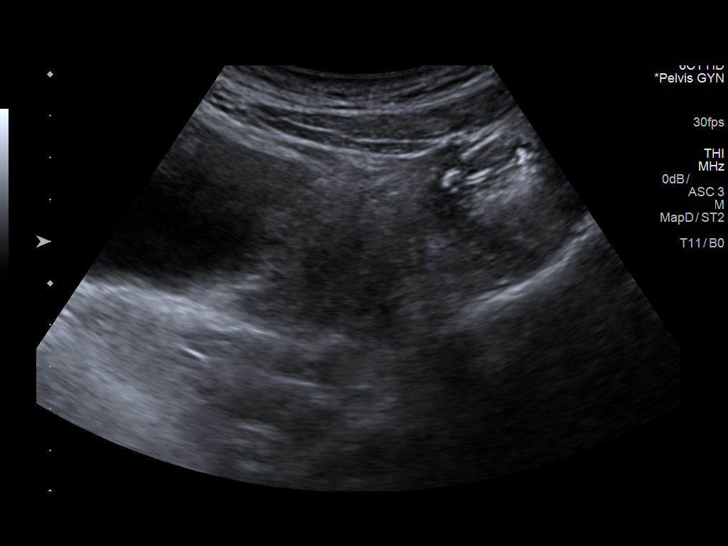
[im 20/79]
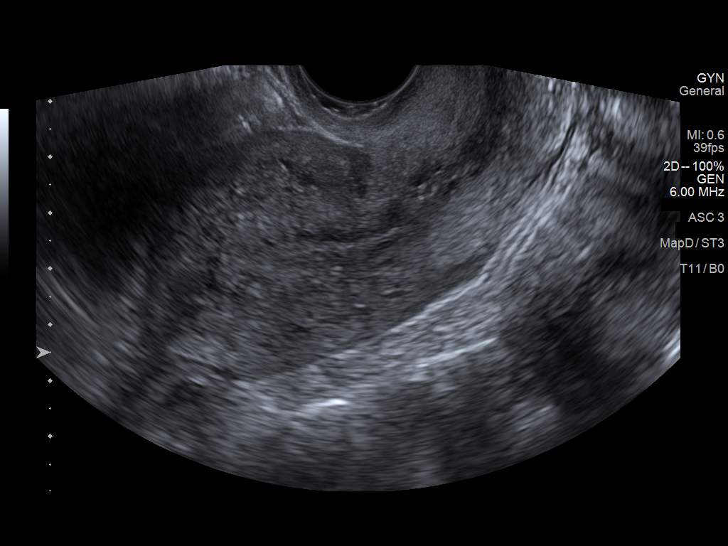
[im 27/79]
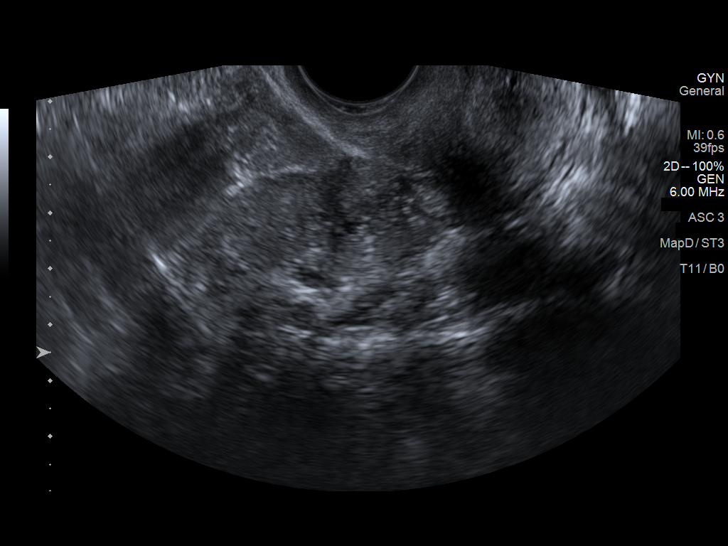
[im 30/79]
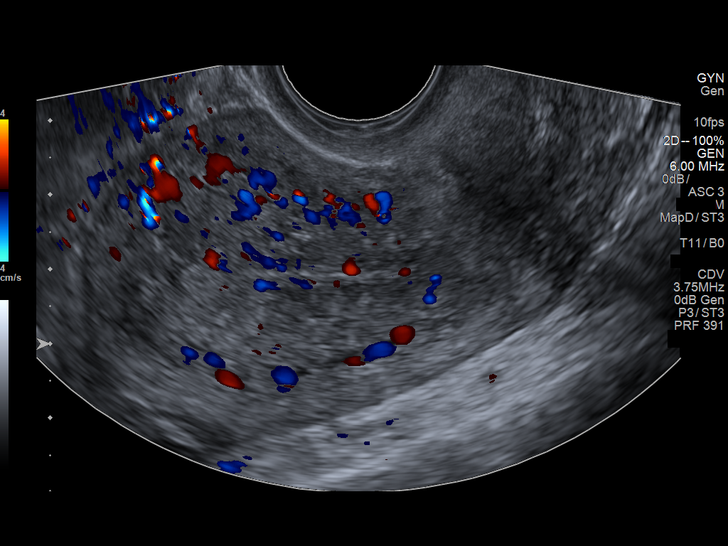
[im 36/79]
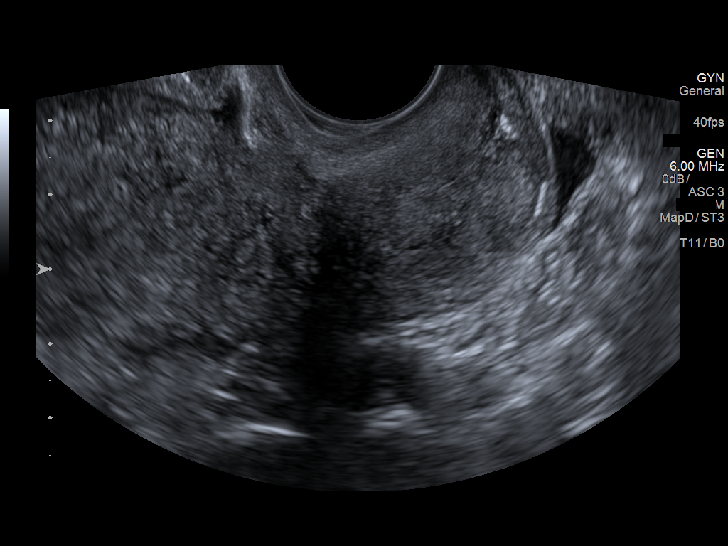
[im 43/79]
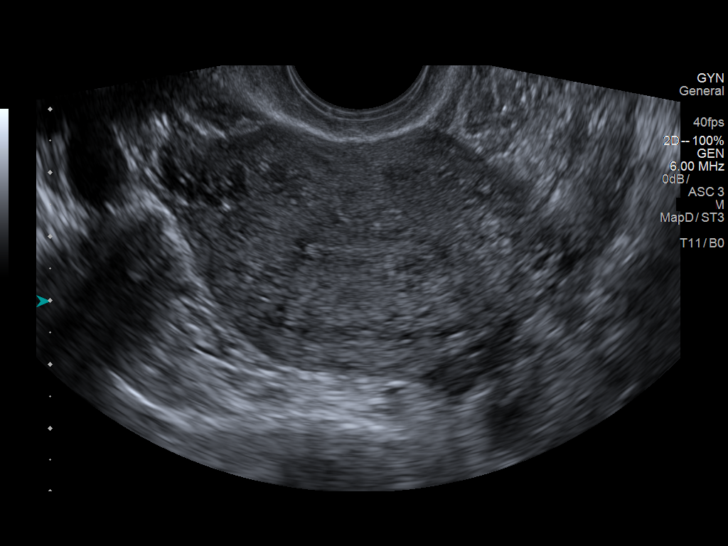
[im 49/79]
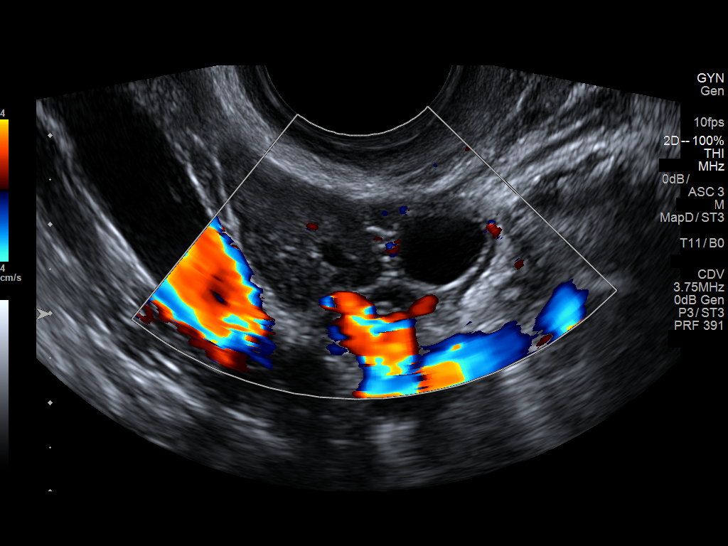
[im 53/79]
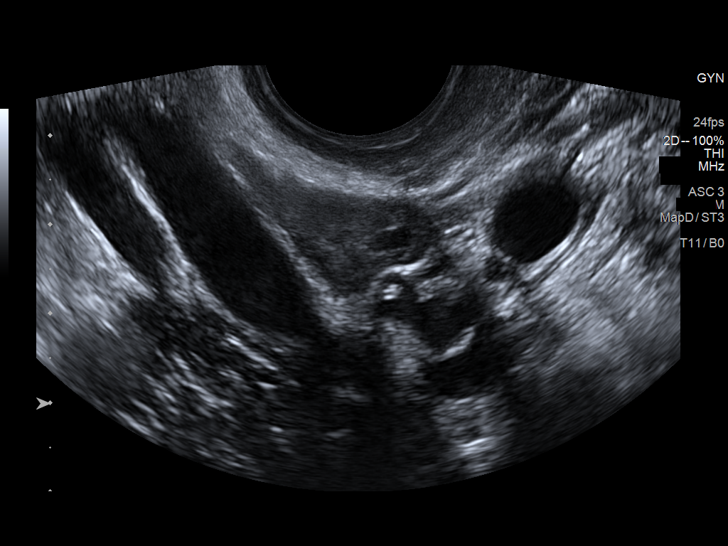
[im 59/79]
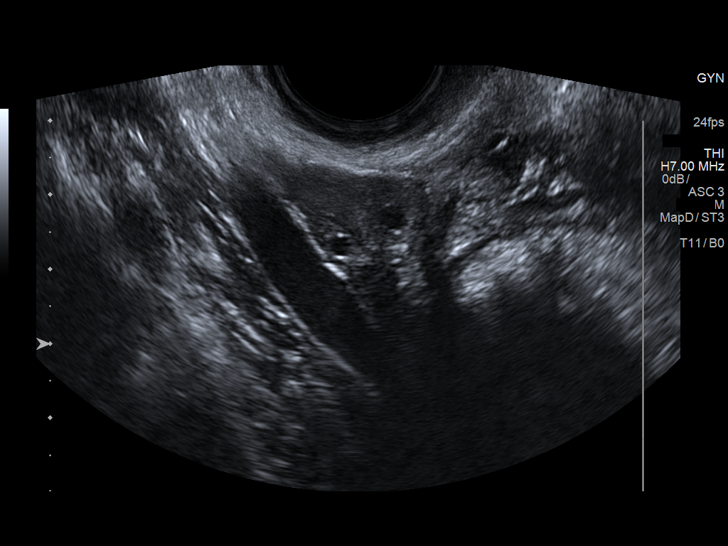
[im 66/79]
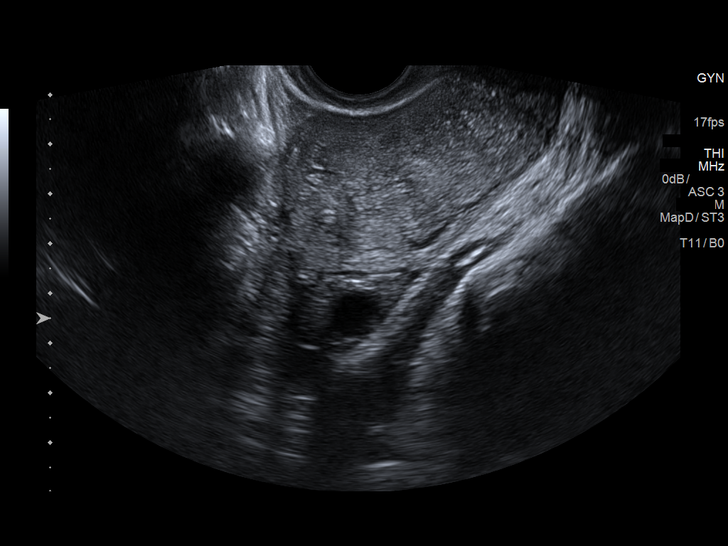
[im 72/79]
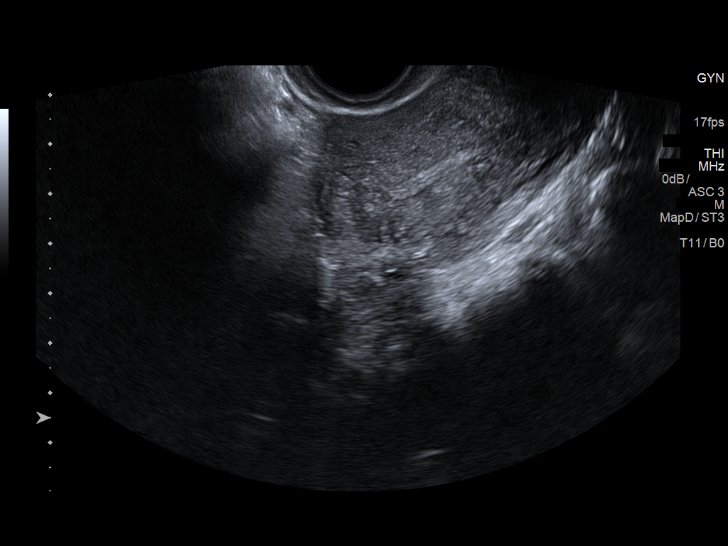
[im 79/79]
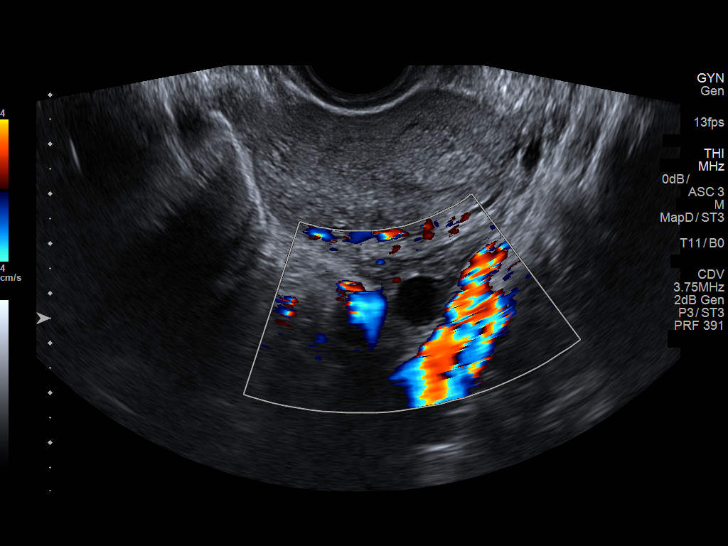

[14 of 25 positions shown; findings below may reference images not displayed]

FINDINGS: Uterus

Measurements: 8.5 x 4.2 x 5.1 cm. No fibroids or other mass
visualized.

Endometrium

Thickness: 4 mm.  No focal abnormality visualized.

Right ovary

Measurements: 2.7 x 1.6 x 1.6 cm. There is a 1.1 x 1.0 x 1.1 cm
paraovarian cyst.

Left ovary

Measurements: 3.4 x 1.9 x 2.2 cm. Normal appearance/no adnexal mass.

Other findings

No abnormal free fluid.
IMPRESSION: Unremarkable pelvic ultrasound.

## 2016-06-18 ENCOUNTER — Emergency Department (HOSPITAL_BASED_OUTPATIENT_CLINIC_OR_DEPARTMENT_OTHER)
Admission: EM | Admit: 2016-06-18 | Discharge: 2016-06-18 | Disposition: A | Discharge status: Home / Self Care | Payer: Medicaid Other | Attending: Emergency Medicine | Admitting: Emergency Medicine

## 2016-06-18 ENCOUNTER — Encounter (HOSPITAL_BASED_OUTPATIENT_CLINIC_OR_DEPARTMENT_OTHER): Payer: Self-pay | Admitting: *Deleted

## 2016-06-18 DIAGNOSIS — Z79899 Other long term (current) drug therapy: Secondary | ICD-10-CM | POA: Diagnosis not present

## 2016-06-18 DIAGNOSIS — Z87891 Personal history of nicotine dependence: Secondary | ICD-10-CM | POA: Diagnosis not present

## 2016-06-18 DIAGNOSIS — R0981 Nasal congestion: Secondary | ICD-10-CM | POA: Diagnosis present

## 2016-06-18 DIAGNOSIS — J0110 Acute frontal sinusitis, unspecified: Secondary | ICD-10-CM

## 2016-06-18 MED ORDER — DEXAMETHASONE SODIUM PHOSPHATE 10 MG/ML IJ SOLN
10.0000 mg | Freq: Once | INTRAMUSCULAR | Status: AC
  Administered 2016-06-18: 10 mg via INTRAMUSCULAR
  Filled 2016-06-18: qty 1

## 2016-06-18 MED ORDER — AMOXICILLIN-POT CLAVULANATE 875-125 MG PO TABS: 1.0000 | ORAL_TABLET | Freq: Two times a day (BID) | ORAL | 0 refills | Status: DC

## 2016-06-18 MED ORDER — IBUPROFEN 400 MG PO TABS
600.0000 mg | ORAL_TABLET | Freq: Once | ORAL | Status: AC
  Administered 2016-06-18: 600 mg via ORAL
  Filled 2016-06-18: qty 1

## 2016-06-18 MED ORDER — FLUTICASONE PROPIONATE 50 MCG/ACT NA SUSP: 1.0000 | Freq: Every day | NASAL | 0 refills | Status: DC

## 2016-06-18 NOTE — Discharge Instructions (Signed)
Please read and follow all provided instructions.  Your diagnoses today include:  1. Acute non-recurrent frontal sinusitis    Tests performed today include: Vital signs. See below for your results today.   Medications prescribed:  Take as prescribed   Home care instructions:  Follow any educational materials contained in this packet.  Follow-up instructions: Please follow-up with your primary care provider for further evaluation of symptoms and treatment   Return instructions:  Please return to the Emergency Department if you do not get better, if you get worse, or new symptoms OR  - Fever (temperature greater than 101.58F)  - Bleeding that does not stop with holding pressure to the area    -Severe pain (please note that you may be more sore the day after your accident)  - Chest Pain  - Difficulty breathing  - Severe nausea or vomiting  - Inability to tolerate food and liquids  - Passing out  - Skin becoming red around your wounds  - Change in mental status (confusion or lethargy)  - New numbness or weakness    Please return if you have any other emergent concerns.  Additional Information:  Your vital signs today were: BP 119/80 (BP Location: Left Arm)    Pulse 78    Temp 98.8 F (37.1 C) (Oral)    Resp 18    Ht 5\' 5"  (1.651 m)    Wt 68 kg    LMP 05/26/2016    SpO2 100%    BMI 24.96 kg/m  If your blood pressure (BP) was elevated above 135/85 this visit, please have this repeated by your doctor within one month. ---------------

## 2016-06-18 NOTE — ED Triage Notes (Signed)
Reports she had the flu last week. C/o facial pressure and green sinus drainage

## 2016-06-18 NOTE — ED Provider Notes (Signed)
[facility] Provider Note   CSN: [id number] Arrival date & time: 06/18/16  1648   By signing my name below, I, Denise Nunez, attest that this documentation has been prepared under the direction and in the presence of [name], PA-C Electronically Signed: Soijett Nunez, ED Scribe. 06/18/16. 8:06 PM.  History   Chief Complaint Chief Complaint  Patient presents with  . Nasal Congestion    HPI Denise Nunez is a 36 y.o. female who presents to the Emergency Department complaining of nasal congestion onset 3 days ag. Pt notes that she had the flu last week. She states that she is having associated symptoms of left sided facial pressure, cough, and right sided HA. She has tried OTC tylenol with no relief of her symptoms. She denies fever, chills, ear pain,.and any other symptoms.    The history is provided by the patient. No language interpreter was used.    Past Medical History:  Diagnosis Date  . B12 deficiency anemia 09/26/2015  . Pernicious anemia 09/28/2015  . UTI (urinary tract infection)     Patient Active Problem List   Diagnosis Date Noted  . Pernicious anemia 09/28/2015  . B12 deficiency anemia 09/26/2015  . Absolute anemia   . Protein-calorie malnutrition, severe 09/24/2015  . Symptomatic anemia 09/23/2015  . Headache 09/23/2015  . Nausea without vomiting 09/23/2015  . Early satiety 09/23/2015  . Indigestion 09/23/2015  . Other fatigue   . Shortness of breath     Past Surgical History:  Procedure Laterality Date  . ESOPHAGOGASTRODUODENOSCOPY (EGD) WITH PROPOFOL N/A 09/25/2015   Procedure: ESOPHAGOGASTRODUODENOSCOPY (EGD) WITH PROPOFOL;  Surgeon: Mauri Pole, MD;  Location: [facility];  Service: Endoscopy;  Laterality: N/A;    OB History    No data available       Home Medications    Prior to Admission medications   Medication Sig Start Date End Date Taking? Authorizing Provider  Cyanocobalamin (NASCOBAL) 500 MCG/0.1ML SOLN Place 0.1 mLs  (500 mcg total) into the nose once a week. 10/26/15   Eliezer Bottom, NP  methylPREDNISolone (MEDROL DOSEPAK) 4 MG TBPK tablet Take as directed on package. 10/26/15   Eliezer Bottom, NP  promethazine (PHENERGAN) 25 MG tablet Take 1 tablet (25 mg total) by mouth every 6 (six) hours as needed for nausea or vomiting. 03/14/16   Leonard Schwartz, MD    Family History No family history on file.  Social History Social History  Substance Use Topics  . Smoking status: Former Research scientist (life sciences)  . Smokeless tobacco: Never Used  . Alcohol use 0.0 oz/week     Comment: occasional     Allergies   Bactrim [sulfamethoxazole-trimethoprim]   Review of Systems Review of Systems  Constitutional: Negative for chills and fever.  HENT: Positive for congestion. Negative for ear pain.        +left sided facial pressure  Respiratory: Positive for cough.   Neurological: Positive for headaches (right sided).    A complete 10 system review of systems was obtained and all systems are negative except as noted in the HPI and PMH.   Physical Exam Updated Vital Signs BP 119/80 (BP Location: Left Arm)   Pulse 78   Temp 98.8 F (37.1 C) (Oral)   Resp 18   Ht 5\' 5"  (1.651 m)   Wt 150 lb (68 kg)   LMP 05/26/2016   SpO2 100%   BMI 24.96 kg/m   Physical Exam  Constitutional: She is oriented to person, place, and  time. She appears well-developed and well-nourished. No distress.  HENT:  Head: Normocephalic and atraumatic.  Right Ear: Tympanic membrane, external ear and ear canal normal.  Left Ear: Tympanic membrane, external ear and ear canal normal.  Nose: Right sinus exhibits maxillary sinus tenderness and frontal sinus tenderness. Left sinus exhibits no maxillary sinus tenderness and no frontal sinus tenderness.  Mouth/Throat: Uvula is midline, oropharynx is clear and moist and mucous membranes are normal.  Eyes: EOM are normal.  Neck: Neck supple.  Cardiovascular: Normal rate, regular rhythm and normal  heart sounds.  Exam reveals no gallop and no friction rub.   No murmur heard. Pulmonary/Chest: Effort normal and breath sounds normal. No respiratory distress. She has no wheezes. She has no rales.  Abdominal: She exhibits no distension.  Musculoskeletal: Normal range of motion.  Neurological: She is alert and oriented to person, place, and time.  Skin: Skin is warm and dry.  Psychiatric: She has a normal mood and affect. Her behavior is normal.  Nursing note and vitals reviewed.    ED Treatments / Results  DIAGNOSTIC STUDIES: Oxygen Saturation is 100% on RA, nl by my interpretation.    COORDINATION OF CARE: 8:03 PM Discussed treatment plan with pt at bedside which includes decadron injection, flonase, augmentin, and pt agreed to plan.   Procedures Procedures (including critical care time)  Medications Ordered in ED Medications - No data to display   Initial Impression / Assessment and Plan / ED Course  I have reviewed the triage vital signs and the nursing notes.   I have reviewed the relevant previous healthcare records. I obtained HPI from historian.  ED Course:  Assessment: Patient is a 36 year old female who presents to the ED complaining of symptoms of sinusitis. Mild to moderate symptoms of clear/yellow nasal discharge/congestion and scratchy throat with cough for less than 10 days.  Patient is afebrile. Will treat in the ED with decadon injection. Patient discharged with augmentin and flonase Rx. Patient instructions given for warm saline nasal washes.  Recommendations for follow-up with primary care physician.     Disposition/Plan:  DC home Additional Verbal discharge instructions given and discussed with patient.  Pt Instructed to f/u with PCP in the next week for evaluation and treatment of symptoms. Return precautions given Pt acknowledges and agrees with plan  Supervising Physician Gwenyth Allegra Tegeler, MD  Final Clinical Impressions(s) / ED Diagnoses    Final diagnoses:  Acute non-recurrent frontal sinusitis    New Prescriptions New Prescriptions   No medications on file    I personally performed the services described in this documentation, which was scribed in my presence. The recorded information has been reviewed and is accurate.     [name], PA-C 06/18/16 2007    Gwenyth Allegra Tegeler, MD 06/19/16 [phone]

## 2016-12-24 ENCOUNTER — Emergency Department (HOSPITAL_BASED_OUTPATIENT_CLINIC_OR_DEPARTMENT_OTHER)
Admission: EM | Admit: 2016-12-24 | Discharge: 2016-12-24 | Disposition: A | Discharge status: Home / Self Care | Payer: 59 | Attending: Emergency Medicine | Admitting: Emergency Medicine

## 2016-12-24 ENCOUNTER — Emergency Department (HOSPITAL_COMMUNITY)
Admission: EM | Admit: 2016-12-24 | Discharge: 2016-12-24 | Discharge status: Against Medical Advice | Payer: 59 | Source: Home / Self Care

## 2016-12-24 ENCOUNTER — Encounter (HOSPITAL_BASED_OUTPATIENT_CLINIC_OR_DEPARTMENT_OTHER): Payer: Self-pay | Admitting: Emergency Medicine

## 2016-12-24 ENCOUNTER — Emergency Department (HOSPITAL_BASED_OUTPATIENT_CLINIC_OR_DEPARTMENT_OTHER): Payer: 59

## 2016-12-24 DIAGNOSIS — R0789 Other chest pain: Secondary | ICD-10-CM | POA: Diagnosis not present

## 2016-12-24 DIAGNOSIS — Z79899 Other long term (current) drug therapy: Secondary | ICD-10-CM | POA: Insufficient documentation

## 2016-12-24 DIAGNOSIS — M25562 Pain in left knee: Secondary | ICD-10-CM | POA: Insufficient documentation

## 2016-12-24 DIAGNOSIS — M79642 Pain in left hand: Secondary | ICD-10-CM | POA: Diagnosis not present

## 2016-12-24 DIAGNOSIS — M79632 Pain in left forearm: Secondary | ICD-10-CM | POA: Diagnosis not present

## 2016-12-24 DIAGNOSIS — R51 Headache: Secondary | ICD-10-CM

## 2016-12-24 DIAGNOSIS — Z5321 Procedure and treatment not carried out due to patient leaving prior to being seen by health care provider: Secondary | ICD-10-CM | POA: Insufficient documentation

## 2016-12-24 DIAGNOSIS — Z87891 Personal history of nicotine dependence: Secondary | ICD-10-CM | POA: Diagnosis not present

## 2016-12-24 DIAGNOSIS — M25561 Pain in right knee: Secondary | ICD-10-CM | POA: Insufficient documentation

## 2016-12-24 DIAGNOSIS — R519 Headache, unspecified: Secondary | ICD-10-CM

## 2016-12-24 DIAGNOSIS — G501 Atypical facial pain: Secondary | ICD-10-CM | POA: Diagnosis not present

## 2016-12-24 MED ORDER — IBUPROFEN 800 MG PO TABS
800.0000 mg | ORAL_TABLET | Freq: Once | ORAL | Status: AC
  Administered 2016-12-24: 800 mg via ORAL
  Filled 2016-12-24: qty 1

## 2016-12-24 MED ORDER — CYCLOBENZAPRINE HCL 10 MG PO TABS: 10.0000 mg | ORAL_TABLET | Freq: Every day | ORAL | 0 refills | Status: DC

## 2016-12-24 MED ORDER — CYCLOBENZAPRINE HCL 5 MG PO TABS
5.0000 mg | ORAL_TABLET | Freq: Once | ORAL | Status: AC
  Administered 2016-12-24: 5 mg via ORAL
  Filled 2016-12-24: qty 1
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
## 2016-12-24 NOTE — ED Notes (Signed)
Pt verbalized understanding of discharge instructions and denies any further questions at this time.   

## 2016-12-24 NOTE — ED Notes (Signed)
Patient also c/o of bilateral knee pain.
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
## 2016-12-24 NOTE — Discharge Instructions (Signed)
Alternate 600 mg of ibuprofen and [phone] mg of Tylenol every 3 hours as needed for pain. Do not exceed 4000 mg of Tylenol daily. You may take flexeril for muscle relaxation but do not drive, drink alcohol or operate machinery with flexeril use. Ice to areas of soreness for the next few days and then may move to heat. Take hot showers or hot baths and do some gentle stretching to ease muscle stiffness. Take short, frequent walks throughout the day to avoid stiffness and pain. Expect to be sore for the next few day and follow up with primary care physician for recheck of ongoing symptoms but return to ER for emergent changing or worsening of symptoms such as loss of control of bowel or bladder, altered mental status, or abnormal gait not due to pain.

## 2016-12-24 NOTE — ED Notes (Signed)
ED Provider at bedside. 

## 2017-10-07 ENCOUNTER — Inpatient Hospital Stay: Payer: 59

## 2017-10-07 ENCOUNTER — Inpatient Hospital Stay: Payer: 59 | Attending: Family | Admitting: Family

## 2018-10-22 ENCOUNTER — Other Ambulatory Visit: Payer: Self-pay | Admitting: Obstetrics and Gynecology

## 2018-10-26 ENCOUNTER — Other Ambulatory Visit: Payer: Self-pay

## 2018-10-26 ENCOUNTER — Ambulatory Visit (HOSPITAL_COMMUNITY)
Admission: RE | Admit: 2018-10-26 | Discharge: 2018-10-26 | Disposition: A | Discharge status: Home / Self Care | Payer: 59 | Source: Ambulatory Visit | Attending: Obstetrics and Gynecology | Admitting: Obstetrics and Gynecology

## 2018-10-26 DIAGNOSIS — D509 Iron deficiency anemia, unspecified: Secondary | ICD-10-CM | POA: Insufficient documentation

## 2018-10-26 MED ORDER — SODIUM CHLORIDE 0.9 % IV SOLN
INTRAVENOUS | Status: DC | PRN
  Administered 2018-10-26: 250 mL via INTRAVENOUS

## 2018-10-26 MED ORDER — SODIUM CHLORIDE 0.9 % IV SOLN
510.0000 mg | Freq: Once | INTRAVENOUS | Status: AC
  Administered 2018-10-26: 510 mg via INTRAVENOUS
  Filled 2018-10-26: qty 17

## 2018-10-26 NOTE — Discharge Instructions (Signed)

## 2018-10-26 NOTE — Progress Notes (Signed)
Patient received Feraheme via a PIV. Observed for at least 30 minutes post infusion.Tolerated well, vitals stable, discharge instructions given, verbalized understanding. Patient alert, oriented and ambulatory at the time of discharge.  

## 2018-10-26 NOTE — Progress Notes (Signed)
PATIENT CARE CENTER NOTE  Diagnosis: Iron Deficiency Anemia   Provider: Servando Salina, MD   Procedure: IV Feraheme    Note: Patient received Feraheme infusion via PIV. Tolerated well with no adverse reaction. Observed patient for 30 minutes post-infusion. Vital signs stable. Discharge instructions given. Patient to come back next week for second Feraheme infusion. Alert, oriented and ambulatory at discharge.

## 2018-11-03 ENCOUNTER — Other Ambulatory Visit: Payer: Self-pay

## 2018-11-03 ENCOUNTER — Ambulatory Visit (HOSPITAL_COMMUNITY)
Admission: RE | Admit: 2018-11-03 | Discharge: 2018-11-03 | Disposition: A | Discharge status: Home / Self Care | Payer: 59 | Source: Ambulatory Visit | Attending: Obstetrics and Gynecology | Admitting: Obstetrics and Gynecology

## 2018-11-03 DIAGNOSIS — D509 Iron deficiency anemia, unspecified: Secondary | ICD-10-CM | POA: Diagnosis not present

## 2018-11-03 MED ORDER — FERUMOXYTOL INJECTION 510 MG/17 ML
510.0000 mg | Freq: Once | INTRAVENOUS | Status: AC
  Administered 2018-11-03: 510 mg via INTRAVENOUS
  Filled 2018-11-03: qty 17

## 2018-11-03 MED ORDER — SODIUM CHLORIDE 0.9 % IV SOLN
INTRAVENOUS | Status: DC | PRN
  Administered 2018-11-03: 250 mL via INTRAVENOUS

## 2018-11-03 NOTE — Progress Notes (Signed)
Patient received Feraheme via PIV. Observed for at least 30 minutes post infusion.Tolerated well, vitals stable, discharge instructions given, verbalized understanding. Patient alert, oriented and ambulatory at the time of discharge.  

## 2018-11-03 NOTE — Discharge Instructions (Signed)

## 2021-03-21 ENCOUNTER — Telehealth: Payer: Self-pay | Admitting: Physician Assistant

## 2021-03-21 NOTE — Telephone Encounter (Signed)
Scheduled appt per 10/25 referral. Pt is aware of appt date and time.  

## 2021-04-02 NOTE — Progress Notes (Signed)
[place] [phone]   Fax:(336) [place] NOTE  Patient Care Team: Glory Buff, MD as PCP - General (Specialist)  Hematological/Oncological History 1) 03/12/2021: Labs from OB/GYN, Dr. Servando Salina WBC 4.0, Hgb 8.8 (L), MCV 69 (L), Plt 421K   2) 04/03/2021: Establish care with Dede Query PA-C  CHIEF COMPLAINTS/PURPOSE OF CONSULTATION:  Microcytic Anemia  HISTORY OF PRESENTING ILLNESS:  Denise Nunez 40 y.o. female with medical history significant for pernicious anemia and vitamin B12 deficiency.  Patient is unaccompanied for this visit.  On exam today, Denise Nunez reports significant fatigue with difficulty to complete daily activities including bathing.  Her appetite has decreased secondary to fatigue.  She denies any changes to her weight.  She denies any nausea or vomiting.  She has intermittent episodes of mild abdominal cramping with triggers unknown.  She reports constipation secondary to eating cornstarch.  She craves cornstarch and ice chips regularly.  She denies easy bruising or signs of bleeding except for heavy menstrual bleeding.  She reports that her menstrual cycles occur every 3 to 3-1/2 weeks, lasting approximately 5 days.  She adds that 4 out of 5 days are heavy bleeding that required changing her pad/tampon every 2 hours.  She is currently not on any birth control but is under the care of a gynecologist.  She reports having headaches and dizziness without any syncopal episodes.  She has shortness of breath with exertion but not at rest.  She denies any fevers, chills, night sweats, chest pain or cough.  She has no other complaints.  Rest of the 10 point ROS is below.  MEDICAL HISTORY:  Past Medical History:  Diagnosis Date   B12 deficiency anemia 09/26/2015   Pernicious anemia 09/28/2015   UTI (urinary tract infection)     SURGICAL HISTORY: Past Surgical History:  Procedure Laterality Date    ESOPHAGOGASTRODUODENOSCOPY (EGD) WITH PROPOFOL N/A 09/25/2015   Procedure: ESOPHAGOGASTRODUODENOSCOPY (EGD) WITH PROPOFOL;  Surgeon: Mauri Pole, MD;  Location: [facility];  Service: Endoscopy;  Laterality: N/A;   HEMORROIDECTOMY      SOCIAL HISTORY: Social History   Socioeconomic History   Marital status: Single    Spouse name: Not on file   Number of children: Not on file   Years of education: Not on file   Highest education level: Not on file  Occupational History   Not on file  Tobacco Use   Smoking status: Former   Smokeless tobacco: Never  Substance and Sexual Activity   Alcohol use: Yes    Alcohol/week: 0.0 standard drinks    Comment: occasional   Drug use: Yes    Frequency: 7.0 times per week    Types: Marijuana   Sexual activity: Never  Other Topics Concern   Not on file  Social History Narrative   Not on file   Social Determinants of Health   Financial Resource Strain: Not on file  Food Insecurity: Not on file  Transportation Needs: Not on file  Physical Activity: Not on file  Stress: Not on file  Social Connections: Not on file  Intimate Partner Violence: Not on file    FAMILY HISTORY: History reviewed. No pertinent family history.  ALLERGIES:  is allergic to bactrim [sulfamethoxazole-trimethoprim].  MEDICATIONS:  Current Outpatient Medications  Medication Sig Dispense Refill   Cyanocobalamin (VITAMIN B-12) 1000 MCG SUBL Place under the tongue daily.     amoxicillin-clavulanate (AUGMENTIN) 875-125 MG tablet Take 1 tablet by mouth every 12 (twelve)  hours. 14 tablet 0   Cyanocobalamin (NASCOBAL) 500 MCG/0.1ML SOLN Place 0.1 mLs (500 mcg total) into the nose once a week. 1 Bottle 4   cyclobenzaprine (FLEXERIL) 10 MG tablet Take 1 tablet (10 mg total) by mouth at bedtime. 5 tablet 0   fluticasone (FLONASE) 50 MCG/ACT nasal spray Place 1 spray into both nostrils daily. 1 g 0   methylPREDNISolone (MEDROL DOSEPAK) 4 MG TBPK tablet Take as directed  on package. 21 tablet 0   promethazine (PHENERGAN) 25 MG tablet Take 1 tablet (25 mg total) by mouth every 6 (six) hours as needed for nausea or vomiting. 30 tablet 0   No current facility-administered medications for this visit.    REVIEW OF SYSTEMS:   Constitutional: ( - ) fevers, ( - )  chills , ( - ) night sweats Eyes: ( - ) blurriness of vision, ( - ) double vision, ( - ) watery eyes Ears, nose, mouth, throat, and face: ( - ) mucositis, ( - ) sore throat Respiratory: ( - ) cough, ( + ) dyspnea, ( - ) wheezes Cardiovascular: ( - ) palpitation, ( - ) chest discomfort, ( - ) lower extremity swelling Gastrointestinal:  ( - ) nausea, ( - ) heartburn, ( - ) change in bowel habits Skin: ( - ) abnormal skin rashes Lymphatics: ( - ) new lymphadenopathy, ( - ) easy bruising Neurological: ( - ) numbness, ( - ) tingling, ( - ) new weaknesses Behavioral/Psych: ( - ) mood change, ( - ) new changes  All other systems were reviewed with the patient and are negative.  PHYSICAL EXAMINATION: ECOG PERFORMANCE STATUS: 1 - Symptomatic but completely ambulatory  Vitals:   04/03/21 0905  BP: 117/60  Pulse: 61  Resp: 18  Temp: (!) 97 F (36.1 C)  SpO2: 100%   Filed Weights   04/03/21 0905  Weight: 193 lb 6 oz (87.7 kg)    GENERAL: well appearing female in NAD  SKIN: skin color, texture, turgor are normal, no rashes or significant lesions EYES: conjunctiva are pink and non-injected, sclera clear OROPHARYNX: no exudate, no erythema; lips, buccal mucosa, and tongue normal  NECK: supple, non-tender LYMPH:  no palpable lymphadenopathy in the cervical or supraclavicular lymph nodes.  LUNGS: clear to auscultation and percussion with normal breathing effort HEART: regular rate & rhythm and no murmurs and no lower extremity edema ABDOMEN: soft, non-tender, non-distended, normal bowel sounds Musculoskeletal: no cyanosis of digits and no clubbing  PSYCH: alert & oriented x 3, fluent speech NEURO: no  focal motor/sensory deficits  LABORATORY DATA:  I have reviewed the data as listed CBC Latest Ref Rng & Units 03/14/2016 11/24/2015 10/26/2015  WBC 4.0 - 10.5 K/uL 3.9(L) 3.4(L) 4.4  Hemoglobin 12.0 - 15.0 g/dL 10.3(L) 11.2(L) 10.6(L)  Hematocrit 36.0 - 46.0 % 31.8(L) 35.3 33.2(L)  Platelets 150 - 400 K/uL 255 286 296    CMP Latest Ref Rng & Units 03/14/2016 10/26/2015 09/26/2015  Glucose 65 - 99 mg/dL 86 81 80  BUN 6 - 20 mg/dL 12 19.4 11  Creatinine 0.44 - 1.00 mg/dL 0.78 0.9 0.82  Sodium 135 - 145 mmol/L 138 139 137  Potassium 3.5 - 5.1 mmol/L 4.3 4.0 3.8  Chloride 101 - 111 mmol/L 105 - 107  CO2 22 - 32 mmol/L 27 26 23   Calcium 8.9 - 10.3 mg/dL 9.3 9.5 9.0  Total Protein 6.4 - 8.3 g/dL - 7.4 -  Total Bilirubin 0.20 - 1.20 mg/dL - 0.69 -  Alkaline Phos 40 - 150 U/L - 46 -  AST 5 - 34 U/L - 14 -  ALT 0 - 55 U/L - 11 -   ASSESSMENT & PLAN Denise Nunez is a 40 y.o. female who presents to the clinic for evaluation for microcytic anemia secondary to iron deficiency.  We reviewed underlying cause of iron deficiency including heavy menstrual bleeding.  Outside labs from 03/12/2021 was reviewed with the patient that showed microcytic anemia with a hemoglobin of 8.8.  Patient is unable to tolerate oral supplementation so we will proceed with IV replacement.  #Iron deficiency anemia 2/2 menstrual bleeding: --Labs today to check CBC, CMP, Iron and TIBC, ferritin, retic panel --Unable to tolerate oral iron supplementation due to constipation --Previously received IV feraheme x 2 doses in June 2020 --Currently under the care of OB/GYN, Dr. Servando Salina --Recommend IV feraheme x 2 doses. --RTC in 8 weeks with labs  #Vitamin B12 deficiency 2/2 possible pernicious anemia: --EGD from 09/25/2015 was unremarkable. Duodenal biopsy showed no active inflammation or villous atrophy identified.  --Currently taking vitamin B12 sublingual supplementation --Repeat Vitamin B12 and methylmalonic levels  today. Consider B12 injection if there is persistent deficiency.   Orders Placed This Encounter  Procedures   CBC with Differential ([facility] [facility] Only)    Standing Status:   Future    Number of Occurrences:   1    Standing Expiration Date:   04/02/2022   CMP ([facility] only)    Standing Status:   Future    Number of Occurrences:   1    Standing Expiration Date:   04/02/2022   Ferritin    Standing Status:   Future    Number of Occurrences:   1    Standing Expiration Date:   04/02/2022   Iron and TIBC    Standing Status:   Future    Number of Occurrences:   1    Standing Expiration Date:   04/02/2022   Retic Panel    Standing Status:   Future    Number of Occurrences:   1    Standing Expiration Date:   04/02/2022   Vitamin B12    Standing Status:   Future    Number of Occurrences:   1    Standing Expiration Date:   04/03/2022   Methylmalonic acid, serum    Standing Status:   Future    Number of Occurrences:   1    Standing Expiration Date:   04/03/2022    All questions were answered. The patient knows to call the clinic with any problems, questions or concerns.  I have spent a total of 60 minutes minutes of face-to-face and non-face-to-face time, preparing to see the patient, obtaining and/or reviewing separately obtained history, performing a medically appropriate examination, counseling and educating the patient, ordering medications/tests, documenting clinical information in the electronic health record, and care coordination.   Dede Query, PA-C Department of Hematology/Oncology [place] at [facility] Phone: [phone]  Patient was seen with Dr. Lorenso Courier.   I have read the above note and personally examined the patient. I agree with the assessment and plan as noted above.  Briefly Denise Nunez is a 40 year old female with medical history significant for iron deficiency anemia secondary to menstrual bleeding and vitamin B12 deficiency.  At this  time the patient's findings are most consistent with persistent iron deficiency anemia.  She is received IV iron therapy before in the past.  Given her  degree of anemia and poor response to tolerance of p.o. iron therapy I would recommend we pursue an additional round of IV iron.  We will plan for Feraheme 510 mg q. 7 days x 2 doses.  Additionally we will treat the patient's vitamin B12 levels are repleted appropriately on vitamin B12 therapy.   Ledell Peoples, MD Department of Hematology/Oncology [place] at [facility] Phone: [phone] Pager: [phone] Email: [email or web address]

## 2021-04-03 ENCOUNTER — Inpatient Hospital Stay: Payer: 59

## 2021-04-03 ENCOUNTER — Inpatient Hospital Stay: Payer: 59 | Attending: Physician Assistant | Admitting: Physician Assistant

## 2021-04-03 ENCOUNTER — Encounter: Payer: Self-pay | Admitting: Physician Assistant

## 2021-04-03 ENCOUNTER — Other Ambulatory Visit: Payer: Self-pay

## 2021-04-03 VITALS — BP 117/60 | HR 61 | Temp 97.0°F | Resp 18 | Wt 193.4 lb

## 2021-04-03 DIAGNOSIS — N920 Excessive and frequent menstruation with regular cycle: Secondary | ICD-10-CM | POA: Insufficient documentation

## 2021-04-03 DIAGNOSIS — D510 Vitamin B12 deficiency anemia due to intrinsic factor deficiency: Secondary | ICD-10-CM | POA: Diagnosis not present

## 2021-04-03 DIAGNOSIS — D509 Iron deficiency anemia, unspecified: Secondary | ICD-10-CM | POA: Insufficient documentation

## 2021-04-03 DIAGNOSIS — D500 Iron deficiency anemia secondary to blood loss (chronic): Secondary | ICD-10-CM | POA: Diagnosis present

## 2021-04-03 LAB — IRON AND TIBC
Iron: 29 ug/dL — ABNORMAL LOW (ref 41–142)
Saturation Ratios: 6 % — ABNORMAL LOW (ref 21–57)
TIBC: 475 ug/dL — ABNORMAL HIGH (ref 236–444)
UIBC: 447 ug/dL — ABNORMAL HIGH (ref 120–384)

## 2021-04-03 LAB — CBC WITH DIFFERENTIAL (CANCER CENTER ONLY)
Abs Immature Granulocytes: 0.01 10*3/uL (ref 0.00–0.07)
Basophils Absolute: 0 10*3/uL (ref 0.0–0.1)
Basophils Relative: 1 %
Eosinophils Absolute: 0.2 10*3/uL (ref 0.0–0.5)
Eosinophils Relative: 6 %
HCT: 31.1 % — ABNORMAL LOW (ref 36.0–46.0)
Hemoglobin: 9.2 g/dL — ABNORMAL LOW (ref 12.0–15.0)
Immature Granulocytes: 0 %
Lymphocytes Relative: 41 %
Lymphs Abs: 1.6 10*3/uL (ref 0.7–4.0)
MCH: 19.5 pg — ABNORMAL LOW (ref 26.0–34.0)
MCHC: 29.6 g/dL — ABNORMAL LOW (ref 30.0–36.0)
MCV: 66 fL — ABNORMAL LOW (ref 80.0–100.0)
Monocytes Absolute: 0.3 10*3/uL (ref 0.1–1.0)
Monocytes Relative: 8 %
Neutro Abs: 1.7 10*3/uL (ref 1.7–7.7)
Neutrophils Relative %: 44 %
Platelet Count: 373 10*3/uL (ref 150–400)
RBC: 4.71 MIL/uL (ref 3.87–5.11)
RDW: 19.5 % — ABNORMAL HIGH (ref 11.5–15.5)
WBC Count: 3.8 10*3/uL — ABNORMAL LOW (ref 4.0–10.5)
nRBC: 0 % (ref 0.0–0.2)

## 2021-04-03 LAB — CMP (CANCER CENTER ONLY)
ALT: 17 U/L (ref 0–44)
AST: 27 U/L (ref 15–41)
Albumin: 4.1 g/dL (ref 3.5–5.0)
Alkaline Phosphatase: 71 U/L (ref 38–126)
Anion gap: 8 (ref 5–15)
BUN: 16 mg/dL (ref 6–20)
CO2: 26 mmol/L (ref 22–32)
Calcium: 9.1 mg/dL (ref 8.9–10.3)
Chloride: 106 mmol/L (ref 98–111)
Creatinine: 1.07 mg/dL — ABNORMAL HIGH (ref 0.44–1.00)
GFR, Estimated: >60 mL/min (ref >60)
Glucose, Bld: 82 mg/dL (ref 70–99)
Potassium: 4.4 mmol/L (ref 3.5–5.1)
Sodium: 140 mmol/L (ref 135–145)
Total Bilirubin: 0.3 mg/dL (ref 0.3–1.2)
Total Protein: 7.4 g/dL (ref 6.5–8.1)

## 2021-04-03 LAB — RETIC PANEL
Immature Retic Fract: 29 % — ABNORMAL HIGH (ref 2.3–15.9)
RBC.: 4.66 MIL/uL (ref 3.87–5.11)
Retic Count, Absolute: 58.7 10*3/uL (ref 19.0–186.0)
Retic Ct Pct: 1.3 % (ref 0.4–3.1)
Reticulocyte Hemoglobin: 20.4 pg — ABNORMAL LOW (ref >27.9)

## 2021-04-03 LAB — FERRITIN: Ferritin: 6 ng/mL — ABNORMAL LOW (ref 11–307)

## 2021-04-03 LAB — VITAMIN B12: Vitamin B-12: 895 pg/mL (ref 180–914)

## 2021-04-04 ENCOUNTER — Telehealth: Payer: Self-pay | Admitting: Pharmacy Technician

## 2021-04-04 ENCOUNTER — Other Ambulatory Visit: Payer: Self-pay | Admitting: Pharmacy Technician

## 2021-04-04 NOTE — Telephone Encounter (Signed)
Dr. Charlies Silvers,  Auth Submission: DENIED - Shirlean Kelly Payer: Holland Falling Medication & CPT/J Code(s) submitted: Feraheme (ferumoxytol) [phone] Route of submission (phone, fax, portal): PHONE: [phone] Auth type: Buy/Bill Units/visits requested: 2 Reference number: [id number]  Denied due to patient has not tried and or failed step therapy of [name] FERRLICIT INFED  Would you like to try [name]?? Please advise.  Maudie Mercury

## 2021-04-04 NOTE — Telephone Encounter (Signed)
We will change the order to [name] and the patient will be scheduled as soon as possible. Denise Nunez

## 2021-04-06 ENCOUNTER — Encounter: Payer: Self-pay | Admitting: Physician Assistant

## 2021-04-06 ENCOUNTER — Encounter: Payer: Self-pay | Admitting: Hematology & Oncology

## 2021-04-06 LAB — METHYLMALONIC ACID, SERUM: Methylmalonic Acid, Quantitative: 123 nmol/L (ref 0–378)

## 2021-04-10 ENCOUNTER — Ambulatory Visit (INDEPENDENT_AMBULATORY_CARE_PROVIDER_SITE_OTHER): Payer: 59

## 2021-04-10 ENCOUNTER — Other Ambulatory Visit: Payer: Self-pay

## 2021-04-10 VITALS — BP 102/66 | HR 69 | Temp 98.5°F | Resp 18 | Ht 65.0 in | Wt 187.8 lb

## 2021-04-10 DIAGNOSIS — D500 Iron deficiency anemia secondary to blood loss (chronic): Secondary | ICD-10-CM | POA: Diagnosis not present

## 2021-04-10 DIAGNOSIS — N920 Excessive and frequent menstruation with regular cycle: Secondary | ICD-10-CM | POA: Diagnosis not present

## 2021-04-10 MED ORDER — DIPHENHYDRAMINE HCL 25 MG PO CAPS
50.0000 mg | ORAL_CAPSULE | Freq: Once | ORAL | Status: AC
  Administered 2021-04-10: 50 mg via ORAL
  Filled 2021-04-10: qty 2

## 2021-04-10 MED ORDER — DIPHENHYDRAMINE HCL 50 MG/ML IJ SOLN: 50.0000 mg | Freq: Once | INTRAMUSCULAR | Status: DC | PRN

## 2021-04-10 MED ORDER — SODIUM CHLORIDE 0.9 % IV SOLN: Freq: Once | INTRAVENOUS | Status: DC | PRN

## 2021-04-10 MED ORDER — ACETAMINOPHEN 325 MG PO TABS
650.0000 mg | ORAL_TABLET | Freq: Once | ORAL | Status: AC
  Administered 2021-04-10: 650 mg via ORAL
  Filled 2021-04-10: qty 2

## 2021-04-10 MED ORDER — SODIUM CHLORIDE 0.9 % IV SOLN
200.0000 mg | Freq: Once | INTRAVENOUS | Status: AC
  Administered 2021-04-10: 200 mg via INTRAVENOUS
  Filled 2021-04-10: qty 10

## 2021-04-10 MED ORDER — FAMOTIDINE IN NACL 20-0.9 MG/50ML-% IV SOLN: 20.0000 mg | Freq: Once | INTRAVENOUS | Status: DC | PRN

## 2021-04-10 MED ORDER — EPINEPHRINE 0.3 MG/0.3ML IJ SOAJ: 0.3000 mg | Freq: Once | INTRAMUSCULAR | Status: DC | PRN

## 2021-04-10 MED ORDER — ALBUTEROL SULFATE HFA 108 (90 BASE) MCG/ACT IN AERS: 2.0000 | INHALATION_SPRAY | Freq: Once | RESPIRATORY_TRACT | Status: DC | PRN

## 2021-04-10 MED ORDER — METHYLPREDNISOLONE SODIUM SUCC 125 MG IJ SOLR: 125.0000 mg | Freq: Once | INTRAMUSCULAR | Status: DC | PRN

## 2021-04-10 NOTE — Progress Notes (Signed)
Diagnosis: Iron Deficiency Anemia  Provider:  Marshell Garfinkel, MD  Procedure: Infusion  IV Type: Peripheral, IV Location: R Antecubital  [name] (Iron Sucrose), Dose: 200 mg  Infusion Start Time: 7955  Infusion Stop Time: 1526  Post Infusion IV Care: Observation period completed and Peripheral IV Discontinued  Discharge: Condition: Good, Destination: Home . AVS provided to patient.   Performed by:  Robbin Escher, Sherlon Handing, LPN

## 2021-04-17 ENCOUNTER — Ambulatory Visit (INDEPENDENT_AMBULATORY_CARE_PROVIDER_SITE_OTHER): Payer: 59

## 2021-04-17 ENCOUNTER — Other Ambulatory Visit: Payer: Self-pay

## 2021-04-17 VITALS — BP 118/73 | HR 66 | Temp 98.4°F | Resp 16 | Ht 65.0 in | Wt 191.0 lb

## 2021-04-17 DIAGNOSIS — D500 Iron deficiency anemia secondary to blood loss (chronic): Secondary | ICD-10-CM

## 2021-04-17 MED ORDER — ACETAMINOPHEN 325 MG PO TABS: 650.0000 mg | ORAL_TABLET | Freq: Once | ORAL | Status: DC

## 2021-04-17 MED ORDER — SODIUM CHLORIDE 0.9 % IV SOLN
200.0000 mg | Freq: Once | INTRAVENOUS | Status: AC
  Administered 2021-04-17: 200 mg via INTRAVENOUS
  Filled 2021-04-17: qty 10

## 2021-04-17 MED ORDER — DIPHENHYDRAMINE HCL 25 MG PO CAPS: 50.0000 mg | ORAL_CAPSULE | Freq: Once | ORAL | Status: DC

## 2021-04-17 NOTE — Progress Notes (Addendum)
Diagnosis: Iron Deficiency Anemia  Provider:  Marshell Garfinkel, MD  Procedure: Infusion  IV Type: Peripheral, IV Location: R Antecubital  [name], Dose: 200 mg  Infusion Start Time: 6438  Infusion Stop Time: 1427  Post Infusion IV Care: Peripheral IV Discontinued  Discharge: Condition: Good, Destination: Home . AVS provided to patient.   Performed by:  Anet Logsdon, Sherlon Handing, LPN

## 2021-04-24 ENCOUNTER — Other Ambulatory Visit: Payer: Self-pay

## 2021-04-24 ENCOUNTER — Ambulatory Visit (INDEPENDENT_AMBULATORY_CARE_PROVIDER_SITE_OTHER): Payer: 59 | Admitting: *Deleted

## 2021-04-24 VITALS — BP 113/76 | HR 85 | Temp 98.5°F | Resp 20 | Ht 65.0 in | Wt 187.6 lb

## 2021-04-24 DIAGNOSIS — N920 Excessive and frequent menstruation with regular cycle: Secondary | ICD-10-CM | POA: Diagnosis not present

## 2021-04-24 DIAGNOSIS — D500 Iron deficiency anemia secondary to blood loss (chronic): Secondary | ICD-10-CM | POA: Diagnosis not present

## 2021-04-24 MED ORDER — ACETAMINOPHEN 325 MG PO TABS: 650.0000 mg | ORAL_TABLET | Freq: Once | ORAL | Status: DC

## 2021-04-24 MED ORDER — DIPHENHYDRAMINE HCL 25 MG PO CAPS: 50.0000 mg | ORAL_CAPSULE | Freq: Once | ORAL | Status: DC

## 2021-04-24 MED ORDER — SODIUM CHLORIDE 0.9 % IV SOLN
200.0000 mg | Freq: Once | INTRAVENOUS | Status: AC
  Administered 2021-04-24: 200 mg via INTRAVENOUS
  Filled 2021-04-24: qty 10

## 2021-04-24 NOTE — Progress Notes (Signed)
Diagnosis: Iron Deficiency Anemia  Provider:  Marshell Garfinkel, MD  Procedure: Infusion  IV Type: Peripheral, IV Location: R Forearm  [name] (Iron Sucrose), Dose: 200 mg  Infusion Start Time: 1409 pm  Infusion Stop Time: 1429 pm  Post Infusion IV Care: Observation period completed and Peripheral IV Discontinued  Discharge: Condition: Good, Destination: Home . AVS provided to patient.   Performed by:  Oren Beckmann, RN

## 2021-04-24 NOTE — Progress Notes (Signed)
Diagnosis: Iron Deficiency Anemia  Provider:  Marshell Garfinkel, MD  Procedure: Infusion  IV Type: Peripheral, IV Location: R Antecubital  [name], Dose: 200 mg  Infusion Start Time: 0301  Infusion Stop Time: 1427  Post Infusion IV Care: Peripheral IV Discontinued  Discharge: Condition: Good, Destination: Home . AVS provided to patient.   Performed by:  Koren Shiver, RN

## 2021-04-25 ENCOUNTER — Telehealth: Payer: Self-pay

## 2021-04-25 NOTE — Telephone Encounter (Signed)
T/C from pt stating her work is requesting FMLA forms to be completed to cover her appts for her iron infusions.  Pt to have them faxed over.

## 2021-05-01 ENCOUNTER — Other Ambulatory Visit: Payer: Self-pay

## 2021-05-01 ENCOUNTER — Ambulatory Visit (INDEPENDENT_AMBULATORY_CARE_PROVIDER_SITE_OTHER): Payer: 59

## 2021-05-01 VITALS — BP 111/67 | HR 77 | Temp 98.1°F | Resp 20 | Ht 65.0 in | Wt 186.6 lb

## 2021-05-01 DIAGNOSIS — D500 Iron deficiency anemia secondary to blood loss (chronic): Secondary | ICD-10-CM | POA: Diagnosis not present

## 2021-05-01 MED ORDER — FAMOTIDINE IN NACL 20-0.9 MG/50ML-% IV SOLN: 20.0000 mg | Freq: Once | INTRAVENOUS | Status: DC | PRN

## 2021-05-01 MED ORDER — SODIUM CHLORIDE 0.9 % IV SOLN
200.0000 mg | Freq: Once | INTRAVENOUS | Status: AC
  Administered 2021-05-01: 200 mg via INTRAVENOUS
  Filled 2021-05-01: qty 10

## 2021-05-01 MED ORDER — METHYLPREDNISOLONE SODIUM SUCC 125 MG IJ SOLR: 125.0000 mg | Freq: Once | INTRAMUSCULAR | Status: DC | PRN

## 2021-05-01 MED ORDER — DIPHENHYDRAMINE HCL 25 MG PO CAPS: 50.0000 mg | ORAL_CAPSULE | Freq: Once | ORAL | Status: DC

## 2021-05-01 MED ORDER — SODIUM CHLORIDE 0.9 % IV SOLN: Freq: Once | INTRAVENOUS | Status: DC | PRN

## 2021-05-01 MED ORDER — EPINEPHRINE 0.3 MG/0.3ML IJ SOAJ: 0.3000 mg | Freq: Once | INTRAMUSCULAR | Status: DC | PRN

## 2021-05-01 MED ORDER — DIPHENHYDRAMINE HCL 50 MG/ML IJ SOLN: 50.0000 mg | Freq: Once | INTRAMUSCULAR | Status: DC | PRN

## 2021-05-01 MED ORDER — ALBUTEROL SULFATE HFA 108 (90 BASE) MCG/ACT IN AERS: 2.0000 | INHALATION_SPRAY | Freq: Once | RESPIRATORY_TRACT | Status: DC | PRN

## 2021-05-01 MED ORDER — ACETAMINOPHEN 325 MG PO TABS: 650.0000 mg | ORAL_TABLET | Freq: Once | ORAL | Status: DC

## 2021-05-01 NOTE — Progress Notes (Signed)
Diagnosis: Iron Deficiency Anemia  Provider:  Marshell Garfinkel, MD  Procedure: Infusion  IV Type: Peripheral, IV Location: L Hand  [name] (Iron Sucrose), Dose: 200 mg  Infusion Start Time: 4099  Infusion Stop Time: 2780  Post Infusion IV Care: Peripheral IV Discontinued  Discharge: Condition: Good, Destination: Home . AVS provided to patient.   Performed by:  Koren Shiver, RN

## 2021-05-08 ENCOUNTER — Ambulatory Visit (INDEPENDENT_AMBULATORY_CARE_PROVIDER_SITE_OTHER): Payer: 59

## 2021-05-08 ENCOUNTER — Telehealth: Payer: Self-pay | Admitting: *Deleted

## 2021-05-08 ENCOUNTER — Other Ambulatory Visit: Payer: Self-pay

## 2021-05-08 VITALS — BP 110/68 | HR 73 | Temp 98.3°F | Resp 16 | Ht 65.0 in | Wt 184.8 lb

## 2021-05-08 DIAGNOSIS — D500 Iron deficiency anemia secondary to blood loss (chronic): Secondary | ICD-10-CM

## 2021-05-08 MED ORDER — METHYLPREDNISOLONE SODIUM SUCC 125 MG IJ SOLR: 125.0000 mg | Freq: Once | INTRAMUSCULAR | Status: DC | PRN

## 2021-05-08 MED ORDER — ALBUTEROL SULFATE HFA 108 (90 BASE) MCG/ACT IN AERS: 2.0000 | INHALATION_SPRAY | Freq: Once | RESPIRATORY_TRACT | Status: DC | PRN

## 2021-05-08 MED ORDER — SODIUM CHLORIDE 0.9 % IV SOLN: Freq: Once | INTRAVENOUS | Status: DC | PRN

## 2021-05-08 MED ORDER — SODIUM CHLORIDE 0.9 % IV SOLN
200.0000 mg | Freq: Once | INTRAVENOUS | Status: AC
  Administered 2021-05-08: 200 mg via INTRAVENOUS
  Filled 2021-05-08: qty 10

## 2021-05-08 MED ORDER — FAMOTIDINE IN NACL 20-0.9 MG/50ML-% IV SOLN: 20.0000 mg | Freq: Once | INTRAVENOUS | Status: DC | PRN

## 2021-05-08 MED ORDER — DIPHENHYDRAMINE HCL 25 MG PO CAPS: 50.0000 mg | ORAL_CAPSULE | Freq: Once | ORAL | Status: DC

## 2021-05-08 MED ORDER — ACETAMINOPHEN 325 MG PO TABS: 650.0000 mg | ORAL_TABLET | Freq: Once | ORAL | Status: DC

## 2021-05-08 MED ORDER — EPINEPHRINE 0.3 MG/0.3ML IJ SOAJ: 0.3000 mg | Freq: Once | INTRAMUSCULAR | Status: DC | PRN

## 2021-05-08 MED ORDER — DIPHENHYDRAMINE HCL 50 MG/ML IJ SOLN: 50.0000 mg | Freq: Once | INTRAMUSCULAR | Status: DC | PRN

## 2021-05-08 NOTE — Progress Notes (Signed)
Diagnosis: Iron Deficiency Anemia  Provider:  Marshell Garfinkel, MD  Procedure: Infusion  IV Type: Peripheral, IV Location: R Antecubital  [name] (Iron Sucrose), Dose: 200 mg  Infusion Start Time: 4997  Infusion Stop Time: 1435  Post Infusion IV Care: Peripheral IV Discontinued  Discharge: Condition: Good, Destination: Home . AVS provided to patient.   Performed by:  Koren Shiver, RN

## 2021-05-08 NOTE — Telephone Encounter (Signed)
Voicemail status check of Denise Nunez [phone]) FMLA form per [place].  Reply sent for staff form in queue to process.  We ask patients to allow 7 to 10 business days (14 calendar) to process.  Form faxed to [employer] 5 04/30/2021 received same day by forms team.

## 2021-05-16 NOTE — Telephone Encounter (Signed)
[employer] FMLA form successfully faxed to [phone] and [phone] at 1641.  Original copy mailed to patient. [address] [place] [address] address on file

## 2021-05-29 ENCOUNTER — Other Ambulatory Visit: Payer: Self-pay | Admitting: Physician Assistant

## 2021-05-29 DIAGNOSIS — D500 Iron deficiency anemia secondary to blood loss (chronic): Secondary | ICD-10-CM

## 2021-05-30 ENCOUNTER — Inpatient Hospital Stay: Payer: 59 | Attending: Physician Assistant

## 2021-05-30 ENCOUNTER — Inpatient Hospital Stay (HOSPITAL_BASED_OUTPATIENT_CLINIC_OR_DEPARTMENT_OTHER): Payer: 59 | Admitting: Physician Assistant

## 2021-05-30 ENCOUNTER — Other Ambulatory Visit: Payer: Self-pay

## 2021-05-30 VITALS — BP 115/66 | HR 60 | Temp 97.0°F | Resp 17 | Wt 192.7 lb

## 2021-05-30 DIAGNOSIS — D510 Vitamin B12 deficiency anemia due to intrinsic factor deficiency: Secondary | ICD-10-CM | POA: Diagnosis not present

## 2021-05-30 DIAGNOSIS — N920 Excessive and frequent menstruation with regular cycle: Secondary | ICD-10-CM | POA: Diagnosis present

## 2021-05-30 DIAGNOSIS — D500 Iron deficiency anemia secondary to blood loss (chronic): Secondary | ICD-10-CM

## 2021-05-30 DIAGNOSIS — E538 Deficiency of other specified B group vitamins: Secondary | ICD-10-CM | POA: Diagnosis not present

## 2021-05-30 LAB — CBC WITH DIFFERENTIAL (CANCER CENTER ONLY)
Abs Immature Granulocytes: 0 10*3/uL (ref 0.00–0.07)
Basophils Absolute: 0 10*3/uL (ref 0.0–0.1)
Basophils Relative: 0 %
Eosinophils Absolute: 0.2 10*3/uL (ref 0.0–0.5)
Eosinophils Relative: 3 %
HCT: 34.8 % — ABNORMAL LOW (ref 36.0–46.0)
Hemoglobin: 10.6 g/dL — ABNORMAL LOW (ref 12.0–15.0)
Immature Granulocytes: 0 %
Lymphocytes Relative: 44 %
Lymphs Abs: 2 10*3/uL (ref 0.7–4.0)
MCH: 21.8 pg — ABNORMAL LOW (ref 26.0–34.0)
MCHC: 30.5 g/dL (ref 30.0–36.0)
MCV: 71.6 fL — ABNORMAL LOW (ref 80.0–100.0)
Monocytes Absolute: 0.4 10*3/uL (ref 0.1–1.0)
Monocytes Relative: 8 %
Neutro Abs: 2.1 10*3/uL (ref 1.7–7.7)
Neutrophils Relative %: 45 %
Platelet Count: 340 10*3/uL (ref 150–400)
RBC: 4.86 MIL/uL (ref 3.87–5.11)
RDW: 23.6 % — ABNORMAL HIGH (ref 11.5–15.5)
WBC Count: 4.6 10*3/uL (ref 4.0–10.5)
nRBC: 0 % (ref 0.0–0.2)

## 2021-05-30 NOTE — Progress Notes (Signed)
[place] [phone]   Fax:(336) [phone]  PROGRESS NOTE  Patient Care Team: Glory Buff, MD as PCP - General (Specialist)  Hematological/Oncological History 1) 03/12/2021: Labs from OB/GYN, Dr. Servando Salina WBC 4.0, Hgb 8.8 (L), MCV 69 (L), Plt 421K   2) 04/03/2021: Establish care with Dede Query PA-C  3) 04/10/2021-05/08/2021: Received IV venofer x 5 doses  CHIEF COMPLAINTS/PURPOSE OF CONSULTATION:  Iron deficiency anemia  HISTORY OF PRESENTING ILLNESS:  Denise Nunez 41 y.o. female with medical history significant for pernicious anemia and vitamin B12 deficiency.  Patient is unaccompanied for this visit.  On exam today, Denise Nunez reports that her energy levels have improved after receiving IV iron.  She is able to complete all her daily activities on her own.  Her appetite is good and she has gained approximately 8 pounds since the last visit.  She denies any nausea, vomiting or abdominal pain.  Her bowel habits are regular without any diarrhea or constipation.  Her cravings for cornstarch and ice chips have improved.  She continues to have heavy menstrual bleeding.  She is awaiting improvement of anemia before proceeding with any interventions to improve menstrual bleeding.  She denies any fevers, chills, night sweats, shortness of breath, chest pain or cough.  She has no other complaints.  Rest of the 10 point ROS is below.  MEDICAL HISTORY:  Past Medical History:  Diagnosis Date   B12 deficiency anemia 09/26/2015   Pernicious anemia 09/28/2015   UTI (urinary tract infection)     SURGICAL HISTORY: Past Surgical History:  Procedure Laterality Date   ESOPHAGOGASTRODUODENOSCOPY (EGD) WITH PROPOFOL N/A 09/25/2015   Procedure: ESOPHAGOGASTRODUODENOSCOPY (EGD) WITH PROPOFOL;  Surgeon: Mauri Pole, MD;  Location: [facility];  Service: Endoscopy;  Laterality: N/A;   HEMORROIDECTOMY      SOCIAL HISTORY: Social History   Socioeconomic  History   Marital status: Single    Spouse name: Not on file   Number of children: Not on file   Years of education: Not on file   Highest education level: Not on file  Occupational History   Not on file  Tobacco Use   Smoking status: Former   Smokeless tobacco: Never  Substance and Sexual Activity   Alcohol use: Yes    Alcohol/week: 0.0 standard drinks    Comment: occasional   Drug use: Yes    Frequency: 7.0 times per week    Types: Marijuana   Sexual activity: Never  Other Topics Concern   Not on file  Social History Narrative   Not on file   Social Determinants of Health   Financial Resource Strain: Not on file  Food Insecurity: Not on file  Transportation Needs: Not on file  Physical Activity: Not on file  Stress: Not on file  Social Connections: Not on file  Intimate Partner Violence: Not on file    FAMILY HISTORY: No family history on file.  ALLERGIES:  is allergic to bactrim [sulfamethoxazole-trimethoprim].  MEDICATIONS:  Current Outpatient Medications  Medication Sig Dispense Refill   Cyanocobalamin (VITAMIN B-12 SL) Place under the tongue daily. 10 mg/ml solution compound 0.1 ml SL daily     lamoTRIgine (LAMICTAL) 100 MG tablet Take 100 mg by mouth at bedtime.     mirtazapine (REMERON) 15 MG tablet Take 15 mg by mouth at bedtime.     amoxicillin-clavulanate (AUGMENTIN) 875-125 MG tablet Take 1 tablet by mouth every 12 (twelve) hours. 14 tablet 0   Cyanocobalamin (NASCOBAL) 500 MCG/0.1ML  SOLN Place 0.1 mLs (500 mcg total) into the nose once a week. 1 Bottle 4   cyclobenzaprine (FLEXERIL) 10 MG tablet Take 1 tablet (10 mg total) by mouth at bedtime. 5 tablet 0   fluticasone (FLONASE) 50 MCG/ACT nasal spray Place 1 spray into both nostrils daily. 1 g 0   methylPREDNISolone (MEDROL DOSEPAK) 4 MG TBPK tablet Take as directed on package. 21 tablet 0   promethazine (PHENERGAN) 25 MG tablet Take 1 tablet (25 mg total) by mouth every 6 (six) hours as needed for  nausea or vomiting. 30 tablet 0   No current facility-administered medications for this visit.    REVIEW OF SYSTEMS:   Constitutional: ( - ) fevers, ( - )  chills , ( - ) night sweats Eyes: ( - ) blurriness of vision, ( - ) double vision, ( - ) watery eyes Ears, nose, mouth, throat, and face: ( - ) mucositis, ( - ) sore throat Respiratory: ( - ) cough, ( - ) dyspnea, ( - ) wheezes Cardiovascular: ( - ) palpitation, ( - ) chest discomfort, ( - ) lower extremity swelling Gastrointestinal:  ( - ) nausea, ( - ) heartburn, ( - ) change in bowel habits Skin: ( - ) abnormal skin rashes Lymphatics: ( - ) new lymphadenopathy, ( - ) easy bruising Neurological: ( - ) numbness, ( - ) tingling, ( - ) new weaknesses Behavioral/Psych: ( - ) mood change, ( - ) new changes  All other systems were reviewed with the patient and are negative.  PHYSICAL EXAMINATION: ECOG PERFORMANCE STATUS: 1 - Symptomatic but completely ambulatory  Vitals:   05/30/21 1500  BP: 115/66  Pulse: 60  Resp: 17  Temp: (!) 97 F (36.1 C)  SpO2: 100%   Filed Weights   05/30/21 1500  Weight: 192 lb 11.2 oz (87.4 kg)    GENERAL: well appearing female in NAD  SKIN: skin color, texture, turgor are normal, no rashes or significant lesions EYES: conjunctiva are pink and non-injected, sclera clear OROPHARYNX: no exudate, no erythema; lips, buccal mucosa, and tongue normal  NECK: supple, non-tender LUNGS: clear to auscultation and percussion with normal breathing effort HEART: regular rate & rhythm and no murmurs and no lower extremity edema ABDOMEN: soft, non-tender, non-distended, normal bowel sounds Musculoskeletal: no cyanosis of digits and no clubbing  PSYCH: alert & oriented x 3, fluent speech NEURO: no focal motor/sensory deficits  LABORATORY DATA:  I have reviewed the data as listed CBC Latest Ref Rng & Units 05/30/2021 04/03/2021 03/14/2016  WBC 4.0 - 10.5 K/uL 4.6 3.8(L) 3.9(L)  Hemoglobin 12.0 - 15.0 g/dL  10.6(L) 9.2(L) 10.3(L)  Hematocrit 36.0 - 46.0 % 34.8(L) 31.1(L) 31.8(L)  Platelets 150 - 400 K/uL 340 373 255    CMP Latest Ref Rng & Units 04/03/2021 03/14/2016 10/26/2015  Glucose 70 - 99 mg/dL 82 86 81  BUN 6 - 20 mg/dL 16 12 19.4  Creatinine 0.44 - 1.00 mg/dL 1.07(H) 0.78 0.9  Sodium 135 - 145 mmol/L 140 138 139  Potassium 3.5 - 5.1 mmol/L 4.4 4.3 4.0  Chloride 98 - 111 mmol/L 106 105 -  CO2 22 - 32 mmol/L 26 27 26   Calcium 8.9 - 10.3 mg/dL 9.1 9.3 9.5  Total Protein 6.5 - 8.1 g/dL 7.4 - 7.4  Total Bilirubin 0.3 - 1.2 mg/dL 0.3 - 0.69  Alkaline Phos 38 - 126 U/L 71 - 46  AST 15 - 41 U/L 27 - 14  ALT 0 -  44 U/L 17 - 11   ASSESSMENT & PLAN Denise Nunez is a 41 y.o. female who returns for follow-up for iron deficiency anemia secondary to menstrual bleeding.  #Iron deficiency anemia 2/2 menstrual bleeding: --Unable to tolerate oral iron supplementation due to constipation --Recently received IV venofer once weekly x 5 doses from 04/10/2021-05/08/2021:  --Currently under the care of OB/GYN, Dr. Servando Salina. Planning to follow up soon to discuss options to improve menstrual bleeding.  --Labs today show improvement of anemia with Hgb of 10.6, MCV 71.6. Iron panel shows ferritin level is 168, serum iron 78 and iron saturation is 26% --No additional IV iron infusions needed at this time.  --Patient will return in 4 weeks for labs only visit to assure Hgb continues to improve.  --RTC in 8 weeks for labs and follow up visit.   #Vitamin B12 deficiency 2/2 possible pernicious anemia: --EGD from 09/25/2015 was unremarkable. Duodenal biopsy showed no active inflammation or villous atrophy identified.  --Vitamin B12 and methylmalonic levels did not show deficiency when check in November 2022.  --Currently taking vitamin B12 sublingual supplementation. Recommend to continue. Patient requests that we take over prescription moving forward which I agreed.    No orders of the defined types were  placed in this encounter.   All questions were answered. The patient knows to call the clinic with any problems, questions or concerns.  I have spent a total of 25 minutes minutes of face-to-face and non-face-to-face time, preparing to see the patient, obtaining and/or reviewing separately obtained history, performing a medically appropriate examination, counseling and educating the patient, ordering tests, documenting clinical information in the electronic health record, and care coordination.   Dede Query, PA-C Department of Hematology/Oncology [place] at [facility] Phone: [phone]

## 2021-05-31 ENCOUNTER — Telehealth: Payer: Self-pay | Admitting: *Deleted

## 2021-05-31 ENCOUNTER — Encounter: Payer: Self-pay | Admitting: Hematology & Oncology

## 2021-05-31 LAB — FERRITIN: Ferritin: 168 ng/mL (ref 11–307)

## 2021-05-31 LAB — IRON AND IRON BINDING CAPACITY (CC-WL,HP ONLY)
Iron: 78 ug/dL (ref 28–170)
Saturation Ratios: 25 % (ref 10.4–31.8)
TIBC: 307 ug/dL (ref 250–450)
UIBC: 229 ug/dL (ref 148–442)

## 2021-05-31 NOTE — Telephone Encounter (Signed)
TCT patients regarding recent lab results: Spoke with her and advised that iron levels have normalized. We would like her to come back in 4 weeks to repeat labs to assure that Hgb continues to improve. No need for another round of IV iron at this time. Dede Query, PA will see her back for labs and telehealth visit in 8 weeks.  Pt voiced understanding.

## 2021-06-01 ENCOUNTER — Telehealth: Payer: Self-pay | Admitting: Physician Assistant

## 2021-06-01 NOTE — Telephone Encounter (Signed)
Scheduled per 1/4 los, attempted to call but did not go through, will mail calender

## 2021-06-29 ENCOUNTER — Inpatient Hospital Stay: Payer: 59

## 2021-07-02 ENCOUNTER — Encounter: Payer: Self-pay | Admitting: Hematology & Oncology

## 2021-07-02 ENCOUNTER — Other Ambulatory Visit: Payer: Self-pay

## 2021-07-02 ENCOUNTER — Inpatient Hospital Stay: Payer: 59 | Attending: Physician Assistant

## 2021-07-02 ENCOUNTER — Telehealth: Payer: Self-pay

## 2021-07-02 DIAGNOSIS — D500 Iron deficiency anemia secondary to blood loss (chronic): Secondary | ICD-10-CM | POA: Diagnosis present

## 2021-07-02 DIAGNOSIS — N920 Excessive and frequent menstruation with regular cycle: Secondary | ICD-10-CM | POA: Insufficient documentation

## 2021-07-02 LAB — IRON AND IRON BINDING CAPACITY (CC-WL,HP ONLY)
Iron: 128 ug/dL (ref 28–170)
Saturation Ratios: 37 % — ABNORMAL HIGH (ref 10.4–31.8)
TIBC: 343 ug/dL (ref 250–450)
UIBC: 215 ug/dL (ref 148–442)

## 2021-07-02 LAB — CBC WITH DIFFERENTIAL (CANCER CENTER ONLY)
Abs Immature Granulocytes: 0.01 10*3/uL (ref 0.00–0.07)
Basophils Absolute: 0 10*3/uL (ref 0.0–0.1)
Basophils Relative: 1 %
Eosinophils Absolute: 0.2 10*3/uL (ref 0.0–0.5)
Eosinophils Relative: 4 %
HCT: 35.1 % — ABNORMAL LOW (ref 36.0–46.0)
Hemoglobin: 11.1 g/dL — ABNORMAL LOW (ref 12.0–15.0)
Immature Granulocytes: 0 %
Lymphocytes Relative: 34 %
Lymphs Abs: 1.4 10*3/uL (ref 0.7–4.0)
MCH: 22.8 pg — ABNORMAL LOW (ref 26.0–34.0)
MCHC: 31.6 g/dL (ref 30.0–36.0)
MCV: 72.2 fL — ABNORMAL LOW (ref 80.0–100.0)
Monocytes Absolute: 0.3 10*3/uL (ref 0.1–1.0)
Monocytes Relative: 8 %
Neutro Abs: 2.2 10*3/uL (ref 1.7–7.7)
Neutrophils Relative %: 53 %
Platelet Count: 292 10*3/uL (ref 150–400)
RBC: 4.86 MIL/uL (ref 3.87–5.11)
RDW: 20.8 % — ABNORMAL HIGH (ref 11.5–15.5)
WBC Count: 4.1 10*3/uL (ref 4.0–10.5)
nRBC: 0 % (ref 0.0–0.2)

## 2021-07-02 LAB — FERRITIN: Ferritin: 84 ng/mL (ref 11–307)

## 2021-07-02 NOTE — Telephone Encounter (Signed)
-----   Message from Lincoln Brigham, PA-C sent at 07/02/2021  1:40 PM EST ----- Please notify patient that Hgb continues to improve and iron levels are in normal range. No further IV iron is needed at this time. We will plan to do follow up visit next month as scheduled.

## 2021-07-02 NOTE — Telephone Encounter (Signed)
Attempted to contact pt with lab results.  Could not LM, mailbox was full and she does not have My Chart.

## 2021-07-02 NOTE — Telephone Encounter (Signed)
Pt advised with VU 

## 2021-07-27 ENCOUNTER — Other Ambulatory Visit: Payer: Self-pay

## 2021-07-27 ENCOUNTER — Inpatient Hospital Stay: Payer: 59 | Attending: Physician Assistant

## 2021-07-27 DIAGNOSIS — N920 Excessive and frequent menstruation with regular cycle: Secondary | ICD-10-CM | POA: Insufficient documentation

## 2021-07-27 DIAGNOSIS — D500 Iron deficiency anemia secondary to blood loss (chronic): Secondary | ICD-10-CM | POA: Insufficient documentation

## 2021-07-27 DIAGNOSIS — E538 Deficiency of other specified B group vitamins: Secondary | ICD-10-CM | POA: Diagnosis not present

## 2021-07-27 LAB — CBC WITH DIFFERENTIAL (CANCER CENTER ONLY)
Abs Immature Granulocytes: 0.01 10*3/uL (ref 0.00–0.07)
Basophils Absolute: 0 10*3/uL (ref 0.0–0.1)
Basophils Relative: 0 %
Eosinophils Absolute: 0.1 10*3/uL (ref 0.0–0.5)
Eosinophils Relative: 3 %
HCT: 39.2 % (ref 36.0–46.0)
Hemoglobin: 12.4 g/dL (ref 12.0–15.0)
Immature Granulocytes: 0 %
Lymphocytes Relative: 35 %
Lymphs Abs: 1.7 10*3/uL (ref 0.7–4.0)
MCH: 23.2 pg — ABNORMAL LOW (ref 26.0–34.0)
MCHC: 31.6 g/dL (ref 30.0–36.0)
MCV: 73.4 fL — ABNORMAL LOW (ref 80.0–100.0)
Monocytes Absolute: 0.4 10*3/uL (ref 0.1–1.0)
Monocytes Relative: 8 %
Neutro Abs: 2.6 10*3/uL (ref 1.7–7.7)
Neutrophils Relative %: 54 %
Platelet Count: 289 10*3/uL (ref 150–400)
RBC: 5.34 MIL/uL — ABNORMAL HIGH (ref 3.87–5.11)
RDW: 17.3 % — ABNORMAL HIGH (ref 11.5–15.5)
WBC Count: 4.9 10*3/uL (ref 4.0–10.5)
nRBC: 0 % (ref 0.0–0.2)

## 2021-07-27 LAB — FERRITIN: Ferritin: 59 ng/mL (ref 11–307)

## 2021-07-27 LAB — IRON AND IRON BINDING CAPACITY (CC-WL,HP ONLY)
Iron: 94 ug/dL (ref 28–170)
Saturation Ratios: 25 % (ref 10.4–31.8)
TIBC: 375 ug/dL (ref 250–450)
UIBC: 281 ug/dL (ref 148–442)

## 2021-07-31 ENCOUNTER — Inpatient Hospital Stay (HOSPITAL_BASED_OUTPATIENT_CLINIC_OR_DEPARTMENT_OTHER): Payer: 59 | Admitting: Physician Assistant

## 2021-07-31 ENCOUNTER — Other Ambulatory Visit: Payer: Self-pay

## 2021-07-31 VITALS — BP 110/62 | HR 66 | Temp 97.7°F | Resp 20 | Wt 188.4 lb

## 2021-07-31 DIAGNOSIS — D500 Iron deficiency anemia secondary to blood loss (chronic): Secondary | ICD-10-CM | POA: Diagnosis not present

## 2021-07-31 DIAGNOSIS — N920 Excessive and frequent menstruation with regular cycle: Secondary | ICD-10-CM | POA: Diagnosis not present

## 2021-07-31 NOTE — Progress Notes (Signed)
[place] [phone]   [phone]  PROGRESS NOTE  Patient Care Team: Glory Buff, MD as PCP - General (Specialist)  Hematological/Oncological History 1) 03/12/2021: Labs from OB/GYN, Dr. Servando Salina WBC 4.0, Hgb 8.8 (L), MCV 69 (L), Plt 421K   2) 04/03/2021: Establish care with Dede Query PA-C  3) 04/10/2021-05/08/2021: Received IV venofer x 5 doses  CHIEF COMPLAINTS/PURPOSE OF CONSULTATION:  Iron deficiency anemia  HISTORY OF PRESENTING ILLNESS:  Denise Nunez 41 y.o. female with medical history significant for pernicious anemia and vitamin B12 deficiency.  Patient is unaccompanied for this visit.  Patient was last seen on 05/30/2021 and in the interim she denies any changes to her health  On exam today, Denise Nunez reports that her energy levels continue to improve since receiving IV iron.  She is able to complete all her daily activities on her own.  She has a good appetite and denies any dietary changes.  She denies nausea, vomiting or abdominal pain.  Her bowel habits are regular without any diarrhea or constipation.  Her menstrual bleeding continues to be the same and she plans to discuss any interventions with her OB/GYN in the next couple months. She denies any fevers, chills, night sweats, shortness of breath, chest pain or cough.  She has no other complaints.  Rest of the 10 point ROS is below.  MEDICAL HISTORY:  Past Medical History:  Diagnosis Date   B12 deficiency anemia 09/26/2015   Pernicious anemia 09/28/2015   UTI (urinary tract infection)     SURGICAL HISTORY: Past Surgical History:  Procedure Laterality Date   ESOPHAGOGASTRODUODENOSCOPY (EGD) WITH PROPOFOL N/A 09/25/2015   Procedure: ESOPHAGOGASTRODUODENOSCOPY (EGD) WITH PROPOFOL;  Surgeon: Mauri Pole, MD;  Location: [facility];  Service: Endoscopy;  Laterality: N/A;   HEMORROIDECTOMY      SOCIAL HISTORY: Social History   Socioeconomic History   Marital  status: Single    Spouse name: Not on file   Number of children: Not on file   Years of education: Not on file   Highest education level: Not on file  Occupational History   Not on file  Tobacco Use   Smoking status: Former   Smokeless tobacco: Never  Substance and Sexual Activity   Alcohol use: Yes    Alcohol/week: 0.0 standard drinks    Comment: occasional   Drug use: Yes    Frequency: 7.0 times per week    Types: Marijuana   Sexual activity: Never  Other Topics Concern   Not on file  Social History Narrative   Not on file   Social Determinants of Health   Financial Resource Strain: Not on file  Food Insecurity: Not on file  Transportation Needs: Not on file  Physical Activity: Not on file  Stress: Not on file  Social Connections: Not on file  Intimate Partner Violence: Not on file    FAMILY HISTORY: No family history on file.  ALLERGIES:  is allergic to bactrim [sulfamethoxazole-trimethoprim].  MEDICATIONS:  Current Outpatient Medications  Medication Sig Dispense Refill   Cyanocobalamin (VITAMIN B-12 SL) Place under the tongue daily. 10 mg/ml solution compound 0.1 ml SL daily     lamoTRIgine (LAMICTAL) 100 MG tablet Take 100 mg by mouth at bedtime.     mirtazapine (REMERON) 15 MG tablet Take 15 mg by mouth at bedtime.     No current facility-administered medications for this visit.    REVIEW OF SYSTEMS:   Constitutional: ( - ) fevers, ( - )  chills , ( - ) night sweats Eyes: ( - ) blurriness of vision, ( - ) double vision, ( - ) watery eyes Ears, nose, mouth, throat, and face: ( - ) mucositis, ( - ) sore throat Respiratory: ( - ) cough, ( - ) dyspnea, ( - ) wheezes Cardiovascular: ( - ) palpitation, ( - ) chest discomfort, ( - ) lower extremity swelling Gastrointestinal:  ( - ) nausea, ( - ) heartburn, ( - ) change in bowel habits Skin: ( - ) abnormal skin rashes Lymphatics: ( - ) new lymphadenopathy, ( - ) easy bruising Neurological: ( - ) numbness, ( -  ) tingling, ( - ) new weaknesses Behavioral/Psych: ( - ) mood change, ( - ) new changes  All other systems were reviewed with the patient and are negative.  PHYSICAL EXAMINATION: ECOG PERFORMANCE STATUS: 0 - Asymptomatic  Vitals:   07/31/21 1525  BP: 110/62  Pulse: 66  Resp: 20  Temp: 97.7 F (36.5 C)  SpO2: 95%   Filed Weights   07/31/21 1525  Weight: 188 lb 6.4 oz (85.5 kg)    GENERAL: well appearing female in NAD  SKIN: skin color, texture, turgor are normal, no rashes or significant lesions EYES: conjunctiva are pink and non-injected, sclera clear OROPHARYNX: no exudate, no erythema; lips, buccal mucosa, and tongue normal  NECK: supple, non-tender LUNGS: clear to auscultation and percussion with normal breathing effort HEART: regular rate & rhythm and no murmurs and no lower extremity edema ABDOMEN: soft, non-tender, non-distended, normal bowel sounds Musculoskeletal: no cyanosis of digits and no clubbing  PSYCH: alert & oriented x 3, fluent speech NEURO: no focal motor/sensory deficits  LABORATORY DATA:  I have reviewed the data as listed CBC Latest Ref Rng & Units 07/27/2021 07/02/2021 05/30/2021  WBC 4.0 - 10.5 K/uL 4.9 4.1 4.6  Hemoglobin 12.0 - 15.0 g/dL 12.4 11.1(L) 10.6(L)  Hematocrit 36.0 - 46.0 % 39.2 35.1(L) 34.8(L)  Platelets 150 - 400 K/uL 289 292 340    CMP Latest Ref Rng & Units 04/03/2021 03/14/2016 10/26/2015  Glucose 70 - 99 mg/dL 82 86 81  BUN 6 - 20 mg/dL 16 12 19.4  Creatinine 0.44 - 1.00 mg/dL 1.07(H) 0.78 0.9  Sodium 135 - 145 mmol/L 140 138 139  Potassium 3.5 - 5.1 mmol/L 4.4 4.3 4.0  Chloride 98 - 111 mmol/L 106 105 -  CO2 22 - 32 mmol/L '26 27 26  '$ Calcium 8.9 - 10.3 mg/dL 9.1 9.3 9.5  Total Protein 6.5 - 8.1 g/dL 7.4 - 7.4  Total Bilirubin 0.3 - 1.2 mg/dL 0.3 - 0.69  Alkaline Phos 38 - 126 U/L 71 - 46  AST 15 - 41 U/L 27 - 14  ALT 0 - 44 U/L 17 - 11   ASSESSMENT & PLAN Denise Nunez is a 41 y.o. female who returns for follow-up for iron  deficiency anemia secondary to menstrual bleeding.  #Iron deficiency anemia 2/2 menstrual bleeding: --Unable to tolerate oral iron supplementation due to constipation --Recently received IV venofer once weekly x 5 doses from 04/10/2021-05/08/2021:  --Currently under the care of OB/GYN, Dr. Servando Salina. Planning to follow up soon to discuss options to improve menstrual bleeding.  --Labs from 07/27/2021 show no evidence of anemia with Hgb of 12.4. Iron panel show ferritin level is 59, serum iron 94 and iron saturation is 25% --No additional IV iron infusions needed at this time.  --Patient will return in 3 months to monitor levels.   #Vitamin B12  deficiency 2/2 possible pernicious anemia: --EGD from 09/25/2015 was unremarkable. Duodenal biopsy showed no active inflammation or villous atrophy identified.  --Vitamin B12 and methylmalonic levels did not show deficiency when check in November 2022.  --Currently taking vitamin B12 sublingual supplementation. Recommend to continue.   #Microcytosis:  --Hemoglobinopathy testing was done on 09/24/2015 which was unremarkable --Plan to order alpha thalassemia testing at next appt.   Orders Placed This Encounter  Procedures   CBC with Differential ([facility] Only)    Standing Status:   Future    Standing Expiration Date:   08/01/2022   Ferritin    Standing Status:   Future    Standing Expiration Date:   08/01/2022   Iron and Iron Binding Capacity ([facility] only)    Standing Status:   Future    Standing Expiration Date:   08/01/2022   Alpha-Thalassemia GenotypR    Standing Status:   Future    Standing Expiration Date:   08/01/2022    All questions were answered. The patient knows to call the clinic with any problems, questions or concerns.  I have spent a total of 25 minutes minutes of face-to-face and non-face-to-face time, preparing to see the patient, obtaining and/or reviewing separately obtained history, performing a medically appropriate  examination, counseling and educating the patient, ordering tests, documenting clinical information in the electronic health record, and care coordination.   Dede Query, PA-C Department of Hematology/Oncology [place] at [facility] Phone: [phone]

## 2021-08-01 ENCOUNTER — Telehealth: Payer: Self-pay | Admitting: Physician Assistant

## 2021-08-01 NOTE — Telephone Encounter (Signed)
Scheduled per 3/7 los, message has been left with pt ?

## 2021-10-24 ENCOUNTER — Encounter: Payer: Self-pay | Admitting: Hematology & Oncology

## 2021-10-31 ENCOUNTER — Inpatient Hospital Stay: Payer: 59 | Admitting: Physician Assistant

## 2021-10-31 ENCOUNTER — Inpatient Hospital Stay: Payer: 59 | Attending: Physician Assistant

## 2021-11-13 ENCOUNTER — Encounter (HOSPITAL_BASED_OUTPATIENT_CLINIC_OR_DEPARTMENT_OTHER): Payer: Self-pay | Admitting: Urology

## 2021-11-13 ENCOUNTER — Encounter: Payer: Self-pay | Admitting: Hematology & Oncology

## 2021-11-13 ENCOUNTER — Emergency Department (HOSPITAL_BASED_OUTPATIENT_CLINIC_OR_DEPARTMENT_OTHER)
Admission: EM | Admit: 2021-11-13 | Discharge: 2021-11-13 | Disposition: A | Discharge status: Home / Self Care | Payer: Medicaid Other | Attending: Emergency Medicine | Admitting: Emergency Medicine

## 2021-11-13 DIAGNOSIS — W25XXXA Contact with sharp glass, initial encounter: Secondary | ICD-10-CM | POA: Insufficient documentation

## 2021-11-13 DIAGNOSIS — Z23 Encounter for immunization: Secondary | ICD-10-CM | POA: Insufficient documentation

## 2021-11-13 DIAGNOSIS — S61411A Laceration without foreign body of right hand, initial encounter: Secondary | ICD-10-CM | POA: Insufficient documentation

## 2021-11-13 MED ORDER — TETANUS-DIPHTH-ACELL PERTUSSIS 5-2.5-18.5 LF-MCG/0.5 IM SUSY
0.5000 mL | PREFILLED_SYRINGE | Freq: Once | INTRAMUSCULAR | Status: AC
  Administered 2021-11-13: 0.5 mL via INTRAMUSCULAR
  Filled 2021-11-13: qty 0.5

## 2021-11-13 MED ORDER — LIDOCAINE-EPINEPHRINE 2 %-1:200000 IJ SOLN
5.0000 mL | Freq: Once | INTRAMUSCULAR | Status: AC
Start: 2021-11-13 — End: 2021-11-13
  Administered 2021-11-13: 5 mL
  Filled 2021-11-13: qty 20

## 2021-11-13 NOTE — ED Notes (Signed)
D/c paperwork reviewed with pt, f/u care. Pt verbalized understanding, no questions or concerns at time of d/c. Ambulatory to ED exit.

## 2021-11-13 NOTE — ED Triage Notes (Signed)
Cut right hand with glass bottle last night , approx 2 cm lac noted, bleeding controlled  TDAP unknown

## 2021-11-13 NOTE — ED Provider Notes (Signed)
[facility] EMERGENCY DEPARTMENT Provider Note   CSN: [id number] Arrival date & time: 11/13/21  1300     History  Chief Complaint  Patient presents with   Laceration    Denise Nunez is a 41 y.o. female.  Patient is a 41 year old female presenting to laceration to right hand.  Patient states last night she cut it on a glass bottle.  States she presented to the emergency department but wait times were too long.  Presenting to today.  Wound is approximately 2 cm long.  Denies active bleeding.  Unknown Tdap status.  The history is provided by the patient. No language interpreter was used.  Laceration Associated symptoms: no fever        Home Medications Prior to Admission medications   Medication Sig Start Date End Date Taking? Authorizing Provider  Cyanocobalamin (VITAMIN B-12 SL) Place under the tongue daily. 10 mg/ml solution compound 0.1 ml SL daily    [provider]  lamoTRIgine (LAMICTAL) 100 MG tablet Take 100 mg by mouth at bedtime. 04/26/21   [provider]  mirtazapine (REMERON) 15 MG tablet Take 15 mg by mouth at bedtime. 04/26/21   [provider]      Allergies    Bactrim [sulfamethoxazole-trimethoprim]    Review of Systems   Review of Systems  Constitutional:  Negative for chills and fever.  Skin:  Positive for wound. Negative for color change.  Neurological:  Negative for weakness and numbness.    Physical Exam Updated Vital Signs BP 128/80 (BP Location: Left Arm)   Pulse 82   Temp 97.9 F (36.6 C) (Oral)   Resp 16   Ht '5\' 5"'$  (1.651 m)   Wt 86.2 kg   LMP 11/13/2021 (Exact Date)   SpO2 100%   BMI 31.62 kg/m  Physical Exam Vitals and nursing note reviewed.  Constitutional:      Appearance: Normal appearance.  HENT:     Head: Normocephalic and atraumatic.  Cardiovascular:     Rate and Rhythm: Normal rate.     Pulses:          Radial pulses are 2+ on the right side and 2+ on the left side.  Pulmonary:      Effort: Pulmonary effort is normal.  Skin:    Capillary Refill: Capillary refill takes less than 2 seconds.       Neurological:     General: No focal deficit present.     Mental Status: She is alert.     GCS: GCS eye subscore is 4. GCS verbal subscore is 5. GCS motor subscore is 6.     Sensory: Sensation is intact.     Motor: Motor function is intact.     ED Results / Procedures / Treatments   Labs (all labs ordered are listed, but only abnormal results are displayed) Labs Reviewed - No data to display  EKG None  Radiology No results found.  Procedures .Marland KitchenLaceration Repair  Date/Time: 11/13/2021 2:44 PM  Performed by: Lianne Cure, DO Authorized by: Lianne Cure, DO   Consent:    Consent obtained:  Verbal   Consent given by:  Patient   Risks, benefits, and alternatives were discussed: yes     Risks discussed:  Pain and poor wound healing   Alternatives discussed:  No treatment Universal protocol:    Patient identity confirmed:  Verbally with patient, arm band and provided demographic data Anesthesia:    Anesthesia method:  Local infiltration  Local anesthetic:  Lidocaine 1% WITH epi Laceration details:    Location:  Hand   Hand location:  R palm   Length (cm):  2 Exploration:    Imaging outcome: foreign body not noted     Contaminated: no   Treatment:    Area cleansed with:  Saline   Amount of cleaning:  Standard   Irrigation solution:  Sterile saline   Irrigation method:  Tap   Debridement:  None   Undermining:  None   Scar revision: no   Skin repair:    Repair method:  Sutures   Suture size:  4-0   Suture technique:  Simple interrupted Approximation:    Approximation:  Close Repair type:    Repair type:  Simple Post-procedure details:    Dressing:  Tube gauze and sterile dressing   Procedure completion:  Tolerated well, no immediate complications     Medications Ordered in ED Medications  lidocaine-EPINEPHrine (XYLOCAINE W/EPI) 2  %-1:200000 (PF) injection 5 mL (5 mLs Infiltration Given by Other 11/13/21 1427)  Tdap (BOOSTRIX) injection 0.5 mL (0.5 mLs Intramuscular Given 11/13/21 1425)    ED Course/ Medical Decision Making/ A&P                           Medical Decision Making Risk Prescription drug management.   2:57 PM The 18-year-old female presenting for laceration that occurred last night.  Tdap status unknown.  Patient is alert and oriented x3, no acute distress, afebrile, stable vital signs.  Hand is neurovascular intact exam.  Physical exam demonstrate 2 cm laceration to thenar eminence of the right hand.  No signs of infection.  Tdap given.  Lidocaine infiltration injected.  Wound irrigated and cleaned with normal saline.  Wound approximated and closed with approximately 4 sutures.  Recommended for close follow-up in the next 7 days for suture removal.  Patient in no distress and overall condition improved here in the ED. Detailed discussions were had with the patient regarding current findings, and need for close f/u with PCP or on call doctor. The patient has been instructed to return immediately if the symptoms worsen in any way for re-evaluation. Patient verbalized understanding and is in agreement with current care plan. All questions answered prior to discharge.         Final Clinical Impression(s) / ED Diagnoses Final diagnoses:  Laceration of right hand without foreign body, initial encounter    Rx / DC Orders ED Discharge Orders     None         Lianne Cure, DO 96/04/54 1445

## 2021-11-13 NOTE — Discharge Instructions (Signed)
Follow-up for suture removal in 7 days.

## 2021-11-14 ENCOUNTER — Encounter: Payer: Self-pay | Admitting: Hematology & Oncology

## 2021-11-20 ENCOUNTER — Encounter: Payer: Self-pay | Admitting: Hematology & Oncology

## 2021-11-20 ENCOUNTER — Encounter (HOSPITAL_BASED_OUTPATIENT_CLINIC_OR_DEPARTMENT_OTHER): Payer: Self-pay | Admitting: Urology

## 2021-11-20 ENCOUNTER — Other Ambulatory Visit: Payer: Self-pay

## 2021-11-20 ENCOUNTER — Emergency Department (HOSPITAL_BASED_OUTPATIENT_CLINIC_OR_DEPARTMENT_OTHER)
Admission: EM | Admit: 2021-11-20 | Discharge: 2021-11-20 | Disposition: A | Discharge status: Home / Self Care | Payer: Medicaid Other | Attending: Emergency Medicine | Admitting: Emergency Medicine

## 2021-11-20 DIAGNOSIS — X58XXXD Exposure to other specified factors, subsequent encounter: Secondary | ICD-10-CM | POA: Insufficient documentation

## 2021-11-20 DIAGNOSIS — Z4802 Encounter for removal of sutures: Secondary | ICD-10-CM | POA: Insufficient documentation

## 2021-11-20 DIAGNOSIS — S61411D Laceration without foreign body of right hand, subsequent encounter: Secondary | ICD-10-CM | POA: Insufficient documentation

## 2021-11-20 NOTE — ED Provider Notes (Signed)
[facility] EMERGENCY DEPARTMENT Provider Note   CSN: [id number] Arrival date & time: 11/20/21  1445     History Chief Complaint  Patient presents with   Suture / Staple Removal    Denise Nunez is a 41 y.o. female patient presents to the emergency department today for further evaluation of suture removal.  Patient had sutures placed over the thenar eminence of the right hand on 11/13/2021.  They have been in for 7 days.  Denies any drainage, fever, chills.   Suture / Staple Removal       Home Medications Prior to Admission medications   Medication Sig Start Date End Date Taking? Authorizing Provider  Cyanocobalamin (VITAMIN B-12 SL) Place under the tongue daily. 10 mg/ml solution compound 0.1 ml SL daily    [provider]  lamoTRIgine (LAMICTAL) 100 MG tablet Take 100 mg by mouth at bedtime. 04/26/21   [provider]  mirtazapine (REMERON) 15 MG tablet Take 15 mg by mouth at bedtime. 04/26/21   [provider]      Allergies    Bactrim [sulfamethoxazole-trimethoprim]    Review of Systems   Review of Systems  All other systems reviewed and are negative.   Physical Exam Updated Vital Signs BP 119/85 (BP Location: Left Arm)   Pulse (!) 59   Temp 99.1 F (37.3 C) (Oral)   Resp 16   Ht '5\' 5"'$  (1.651 m)   Wt 86.2 kg   LMP 11/13/2021 (Exact Date)   SpO2 100%   BMI 31.62 kg/m  Physical Exam Vitals and nursing note reviewed.  Constitutional:      Appearance: Normal appearance.  HENT:     Head: Normocephalic and atraumatic.  Eyes:     General:        Right eye: No discharge.        Left eye: No discharge.     Conjunctiva/sclera: Conjunctivae normal.  Pulmonary:     Effort: Pulmonary effort is normal.  Skin:    General: Skin is warm and dry.     Findings: No rash.     Comments: Well-healing laceration over the right thenar eminence with 4 sutures in place.  Neurological:     General: No focal deficit present.     Mental  Status: She is alert.  Psychiatric:        Mood and Affect: Mood normal.        Behavior: Behavior normal.     ED Results / Procedures / Treatments   Labs (all labs ordered are listed, but only abnormal results are displayed) Labs Reviewed - No data to display  EKG None  Radiology No results found.  Procedures .Suture Removal  Date/Time: 11/20/2021 3:03 PM  Performed by: Hendricks Limes, PA-C Authorized by: Hendricks Limes, PA-C   Consent:    Consent obtained:  Verbal   Consent given by:  Patient   Risks, benefits, and alternatives were discussed: yes     Risks discussed:  Bleeding Universal protocol:    Procedure explained and questions answered to patient or proxy's satisfaction: yes     Relevant documents present and verified: yes     Test results available: no     Imaging studies available: no     Required blood products, implants, devices, and special equipment available: no     Site/side marked: no     Immediately prior to procedure, a time out was called: no     Patient identity confirmed:  Verbally with patient and arm band Location:    Location:  Upper extremity   Upper extremity location:  Hand   Hand location:  R hand Procedure details:    Wound appearance:  Clean   Number of sutures removed:  4 Post-procedure details:    Post-removal:  No dressing applied   Procedure completion:  Tolerated well, no immediate complications     Medications Ordered in ED Medications - No data to display  ED Course/ Medical Decision Making/ A&P                           Medical Decision Making Denise Nunez is a 41 y.o. female patient who presents to the emergency department for suture removal.  All 4 sutures were removed.  Wound appears well-healing with no evidence of erythema, purulence, or warmth.  Instructed appropriate wound care. She is safe for discharge.     Final Clinical Impression(s) / ED Diagnoses Final diagnoses:  Visit for suture removal    Rx  / DC Orders ED Discharge Orders     None         Hendricks Limes, [place] 11/20/21 1504    Lucrezia Starch, MD 11/21/21 2025

## 2021-11-20 NOTE — ED Triage Notes (Signed)
Here for suture removal to right palm after sutures on 6/20, wound looks well approximated and no signs of infection

## 2021-11-20 NOTE — ED Notes (Signed)
ED Provider at bedside. 

## 2021-11-21 ENCOUNTER — Encounter: Payer: Self-pay | Admitting: Hematology & Oncology

## 2022-12-09 ENCOUNTER — Encounter: Payer: Self-pay | Admitting: Hematology & Oncology
